# Patient Record
Sex: Female | Born: 1935 | Race: White | Hispanic: Yes | Marital: Married | Smoking: Never smoker
Health system: Southern US, Community
[De-identification: ages and names within clinical notes are randomized; demographics above are authoritative.]

## PROBLEM LIST (undated history)

## (undated) DIAGNOSIS — I10 Essential (primary) hypertension: Secondary | ICD-10-CM

## (undated) DIAGNOSIS — E785 Hyperlipidemia, unspecified: Secondary | ICD-10-CM

## (undated) DIAGNOSIS — R42 Dizziness and giddiness: Secondary | ICD-10-CM

## (undated) DIAGNOSIS — C50919 Malignant neoplasm of unspecified site of unspecified female breast: Secondary | ICD-10-CM

## (undated) DIAGNOSIS — I2510 Atherosclerotic heart disease of native coronary artery without angina pectoris: Secondary | ICD-10-CM

## (undated) DIAGNOSIS — K5790 Diverticulosis of intestine, part unspecified, without perforation or abscess without bleeding: Secondary | ICD-10-CM

## (undated) DIAGNOSIS — M329 Systemic lupus erythematosus, unspecified: Secondary | ICD-10-CM

## (undated) DIAGNOSIS — E039 Hypothyroidism, unspecified: Secondary | ICD-10-CM

## (undated) DIAGNOSIS — R55 Syncope and collapse: Secondary | ICD-10-CM

## (undated) DIAGNOSIS — IMO0002 Reserved for concepts with insufficient information to code with codable children: Secondary | ICD-10-CM

## (undated) HISTORY — DX: Malignant neoplasm of unspecified site of unspecified female breast: C50.919

## (undated) HISTORY — DX: Essential (primary) hypertension: I10

## (undated) HISTORY — DX: Dizziness and giddiness: R42

## (undated) HISTORY — DX: Hypothyroidism, unspecified: E03.9

## (undated) HISTORY — DX: Reserved for concepts with insufficient information to code with codable children: IMO0002

## (undated) HISTORY — DX: Hyperlipidemia, unspecified: E78.5

## (undated) HISTORY — PX: MASTECTOMY: SHX3

## (undated) HISTORY — DX: Syncope and collapse: R55

## (undated) HISTORY — PX: COLON RESECTION: SHX5231

## (undated) HISTORY — DX: Diverticulosis of intestine, part unspecified, without perforation or abscess without bleeding: K57.90

## (undated) HISTORY — PX: APPENDECTOMY: SHX54

## (undated) HISTORY — DX: Atherosclerotic heart disease of native coronary artery without angina pectoris: I25.10

## (undated) HISTORY — DX: Systemic lupus erythematosus, unspecified: M32.9

## (undated) HISTORY — PX: ABDOMINAL HYSTERECTOMY: SHX81

---

## 2006-03-12 ENCOUNTER — Encounter: Admission: RE | Admit: 2006-03-12 | Discharge: 2006-03-12 | Payer: Self-pay | Admitting: Family Medicine

## 2006-08-23 ENCOUNTER — Encounter: Admission: RE | Admit: 2006-08-23 | Discharge: 2006-08-23 | Payer: Self-pay | Admitting: Family Medicine

## 2006-08-26 ENCOUNTER — Encounter: Admission: RE | Admit: 2006-08-26 | Discharge: 2006-08-26 | Payer: Self-pay | Admitting: Family Medicine

## 2006-08-26 ENCOUNTER — Encounter (INDEPENDENT_AMBULATORY_CARE_PROVIDER_SITE_OTHER): Payer: Self-pay | Admitting: *Deleted

## 2006-09-05 ENCOUNTER — Encounter: Admission: RE | Admit: 2006-09-05 | Discharge: 2006-09-05 | Payer: Self-pay | Admitting: Family Medicine

## 2006-09-09 ENCOUNTER — Encounter (INDEPENDENT_AMBULATORY_CARE_PROVIDER_SITE_OTHER): Payer: Self-pay | Admitting: Specialist

## 2006-09-09 ENCOUNTER — Encounter: Admission: RE | Admit: 2006-09-09 | Discharge: 2006-09-09 | Payer: Self-pay | Admitting: Family Medicine

## 2006-09-20 ENCOUNTER — Ambulatory Visit (HOSPITAL_BASED_OUTPATIENT_CLINIC_OR_DEPARTMENT_OTHER): Admission: RE | Admit: 2006-09-20 | Discharge: 2006-09-20 | Payer: Self-pay | Admitting: General Surgery

## 2006-09-20 ENCOUNTER — Encounter (INDEPENDENT_AMBULATORY_CARE_PROVIDER_SITE_OTHER): Payer: Self-pay | Admitting: General Surgery

## 2006-09-20 ENCOUNTER — Encounter: Admission: RE | Admit: 2006-09-20 | Discharge: 2006-09-20 | Payer: Self-pay | Admitting: General Surgery

## 2006-09-20 ENCOUNTER — Encounter (INDEPENDENT_AMBULATORY_CARE_PROVIDER_SITE_OTHER): Payer: Self-pay | Admitting: *Deleted

## 2006-10-09 ENCOUNTER — Encounter (INDEPENDENT_AMBULATORY_CARE_PROVIDER_SITE_OTHER): Payer: Self-pay | Admitting: Specialist

## 2006-10-10 ENCOUNTER — Inpatient Hospital Stay (HOSPITAL_COMMUNITY): Admission: RE | Admit: 2006-10-10 | Discharge: 2006-10-11 | Payer: Self-pay | Admitting: General Surgery

## 2006-10-18 ENCOUNTER — Ambulatory Visit: Payer: Self-pay | Admitting: Oncology

## 2006-10-22 LAB — CBC WITH DIFFERENTIAL/PLATELET
Basophils Absolute: 0.1 10*3/uL (ref 0.0–0.1)
EOS%: 10.1 % — ABNORMAL HIGH (ref 0.0–7.0)
Eosinophils Absolute: 1.2 10*3/uL — ABNORMAL HIGH (ref 0.0–0.5)
HCT: 33.6 % — ABNORMAL LOW (ref 34.8–46.6)
HGB: 11.4 g/dL — ABNORMAL LOW (ref 11.6–15.9)
MCH: 30.4 pg (ref 26.0–34.0)
MCV: 89.5 fL (ref 81.0–101.0)
MONO%: 6.3 % (ref 0.0–13.0)
NEUT%: 60.7 % (ref 39.6–76.8)
lymph#: 2.5 10*3/uL (ref 0.9–3.3)

## 2006-10-22 LAB — COMPREHENSIVE METABOLIC PANEL
AST: 16 U/L (ref 0–37)
BUN: 22 mg/dL (ref 6–23)
Calcium: 9.1 mg/dL (ref 8.4–10.5)
Chloride: 103 mEq/L (ref 96–112)
Creatinine, Ser: 0.89 mg/dL (ref 0.40–1.20)
Glucose, Bld: 83 mg/dL (ref 70–99)

## 2006-10-22 LAB — LACTATE DEHYDROGENASE: LDH: 165 U/L (ref 94–250)

## 2006-11-27 ENCOUNTER — Ambulatory Visit: Payer: Self-pay | Admitting: Internal Medicine

## 2007-04-21 ENCOUNTER — Ambulatory Visit: Payer: Self-pay | Admitting: Gastroenterology

## 2007-06-02 ENCOUNTER — Encounter: Admission: RE | Admit: 2007-06-02 | Discharge: 2007-06-02 | Payer: Self-pay | Admitting: General Surgery

## 2008-03-02 ENCOUNTER — Encounter: Admission: RE | Admit: 2008-03-02 | Discharge: 2008-03-02 | Payer: Self-pay | Admitting: General Surgery

## 2008-03-24 ENCOUNTER — Encounter: Admission: RE | Admit: 2008-03-24 | Discharge: 2008-03-24 | Payer: Self-pay | Admitting: General Surgery

## 2008-05-11 ENCOUNTER — Inpatient Hospital Stay (HOSPITAL_COMMUNITY): Admission: EM | Admit: 2008-05-11 | Discharge: 2008-05-15 | Payer: Self-pay | Admitting: Emergency Medicine

## 2008-05-12 ENCOUNTER — Encounter: Payer: Self-pay | Admitting: Internal Medicine

## 2008-06-02 ENCOUNTER — Encounter: Payer: Self-pay | Admitting: Internal Medicine

## 2008-06-17 ENCOUNTER — Encounter (HOSPITAL_COMMUNITY): Admission: RE | Admit: 2008-06-17 | Discharge: 2008-09-15 | Payer: Self-pay | Admitting: Cardiology

## 2008-09-16 ENCOUNTER — Encounter (HOSPITAL_COMMUNITY): Admission: RE | Admit: 2008-09-16 | Discharge: 2008-12-09 | Payer: Self-pay | Admitting: Cardiology

## 2008-09-24 ENCOUNTER — Encounter: Payer: Self-pay | Admitting: Internal Medicine

## 2008-12-13 ENCOUNTER — Encounter: Admission: RE | Admit: 2008-12-13 | Discharge: 2008-12-30 | Payer: Self-pay | Admitting: Neurology

## 2009-01-05 ENCOUNTER — Encounter: Admission: RE | Admit: 2009-01-05 | Discharge: 2009-02-03 | Payer: Self-pay | Admitting: Neurology

## 2009-03-02 ENCOUNTER — Emergency Department (HOSPITAL_COMMUNITY): Admission: EM | Admit: 2009-03-02 | Discharge: 2009-03-02 | Payer: Self-pay | Admitting: Emergency Medicine

## 2009-12-01 ENCOUNTER — Encounter: Payer: Self-pay | Admitting: Internal Medicine

## 2010-03-20 ENCOUNTER — Encounter: Payer: Self-pay | Admitting: Internal Medicine

## 2010-03-20 ENCOUNTER — Telehealth (INDEPENDENT_AMBULATORY_CARE_PROVIDER_SITE_OTHER): Payer: Self-pay | Admitting: *Deleted

## 2010-03-22 ENCOUNTER — Encounter: Payer: Self-pay | Admitting: Internal Medicine

## 2010-03-22 ENCOUNTER — Encounter (INDEPENDENT_AMBULATORY_CARE_PROVIDER_SITE_OTHER): Payer: Self-pay | Admitting: *Deleted

## 2010-03-22 ENCOUNTER — Ambulatory Visit: Payer: Self-pay | Admitting: Internal Medicine

## 2010-03-22 DIAGNOSIS — E785 Hyperlipidemia, unspecified: Secondary | ICD-10-CM

## 2010-03-22 DIAGNOSIS — R0602 Shortness of breath: Secondary | ICD-10-CM | POA: Insufficient documentation

## 2010-03-22 DIAGNOSIS — R42 Dizziness and giddiness: Secondary | ICD-10-CM

## 2010-03-22 DIAGNOSIS — R079 Chest pain, unspecified: Secondary | ICD-10-CM | POA: Insufficient documentation

## 2010-03-22 DIAGNOSIS — I10 Essential (primary) hypertension: Secondary | ICD-10-CM | POA: Insufficient documentation

## 2010-03-22 DIAGNOSIS — I2510 Atherosclerotic heart disease of native coronary artery without angina pectoris: Secondary | ICD-10-CM | POA: Insufficient documentation

## 2010-03-22 DIAGNOSIS — R011 Cardiac murmur, unspecified: Secondary | ICD-10-CM

## 2010-03-22 LAB — CONVERTED CEMR LAB
BUN: 19 mg/dL (ref 6–23)
Basophils Absolute: 0.1 10*3/uL (ref 0.0–0.1)
Basophils Relative: 0.7 % (ref 0.0–3.0)
CO2: 31 meq/L (ref 19–32)
Calcium: 9.3 mg/dL (ref 8.4–10.5)
Chloride: 104 meq/L (ref 96–112)
Creatinine, Ser: 0.8 mg/dL (ref 0.4–1.2)
Eosinophils Absolute: 0.2 10*3/uL (ref 0.0–0.7)
Eosinophils Relative: 2.3 % (ref 0.0–5.0)
GFR calc non Af Amer: 73.54 mL/min (ref >60)
Glucose, Bld: 92 mg/dL (ref 70–99)
HCT: 34.4 % — ABNORMAL LOW (ref 36.0–46.0)
Hemoglobin: 11.4 g/dL — ABNORMAL LOW (ref 12.0–15.0)
INR: 1.2 — ABNORMAL HIGH (ref 0.8–1.0)
Lymphocytes Relative: 22.4 % (ref 12.0–46.0)
Lymphs Abs: 1.6 10*3/uL (ref 0.7–4.0)
MCHC: 33.2 g/dL (ref 30.0–36.0)
MCV: 90.4 fL (ref 78.0–100.0)
Monocytes Absolute: 0.5 10*3/uL (ref 0.1–1.0)
Monocytes Relative: 6.3 % (ref 3.0–12.0)
Neutro Abs: 4.8 10*3/uL (ref 1.4–7.7)
Neutrophils Relative %: 68.3 % (ref 43.0–77.0)
Platelets: 265 10*3/uL (ref 150.0–400.0)
Potassium: 3.7 meq/L (ref 3.5–5.1)
Prothrombin Time: 12.7 s — ABNORMAL HIGH (ref 9.1–11.7)
RBC: 3.8 M/uL — ABNORMAL LOW (ref 3.87–5.11)
RDW: 12 % (ref 11.5–14.6)
Sodium: 141 meq/L (ref 135–145)
WBC: 7.2 10*3/uL (ref 4.5–10.5)

## 2010-03-23 ENCOUNTER — Inpatient Hospital Stay (HOSPITAL_BASED_OUTPATIENT_CLINIC_OR_DEPARTMENT_OTHER): Admission: RE | Admit: 2010-03-23 | Discharge: 2010-03-23 | Payer: Self-pay | Admitting: Internal Medicine

## 2010-03-23 ENCOUNTER — Ambulatory Visit: Payer: Self-pay | Admitting: Internal Medicine

## 2010-03-24 ENCOUNTER — Ambulatory Visit (HOSPITAL_COMMUNITY): Admission: RE | Admit: 2010-03-24 | Discharge: 2010-03-24 | Payer: Self-pay | Admitting: Internal Medicine

## 2010-03-24 ENCOUNTER — Encounter: Payer: Self-pay | Admitting: Internal Medicine

## 2010-03-24 ENCOUNTER — Ambulatory Visit: Payer: Self-pay

## 2010-03-24 ENCOUNTER — Ambulatory Visit: Payer: Self-pay | Admitting: Cardiology

## 2010-03-27 ENCOUNTER — Telehealth (INDEPENDENT_AMBULATORY_CARE_PROVIDER_SITE_OTHER): Payer: Self-pay | Admitting: Radiology

## 2010-03-28 ENCOUNTER — Ambulatory Visit: Payer: Self-pay | Admitting: Cardiology

## 2010-03-28 ENCOUNTER — Ambulatory Visit: Payer: Self-pay

## 2010-03-28 ENCOUNTER — Encounter (HOSPITAL_COMMUNITY): Admission: RE | Admit: 2010-03-28 | Discharge: 2010-05-31 | Payer: Self-pay | Admitting: Internal Medicine

## 2010-03-30 ENCOUNTER — Telehealth: Payer: Self-pay | Admitting: Internal Medicine

## 2010-03-31 HISTORY — PX: CARDIAC CATHETERIZATION: SHX172

## 2010-04-03 ENCOUNTER — Telehealth (INDEPENDENT_AMBULATORY_CARE_PROVIDER_SITE_OTHER): Payer: Self-pay | Admitting: *Deleted

## 2010-04-05 ENCOUNTER — Encounter: Payer: Self-pay | Admitting: Internal Medicine

## 2010-04-19 ENCOUNTER — Ambulatory Visit: Payer: Self-pay | Admitting: Internal Medicine

## 2010-04-27 ENCOUNTER — Encounter (HOSPITAL_COMMUNITY): Admission: RE | Admit: 2010-04-27 | Discharge: 2010-07-26 | Payer: Self-pay | Admitting: Internal Medicine

## 2010-04-27 ENCOUNTER — Encounter: Payer: Self-pay | Admitting: Internal Medicine

## 2010-05-01 ENCOUNTER — Encounter: Payer: Self-pay | Admitting: Internal Medicine

## 2010-05-08 ENCOUNTER — Ambulatory Visit (HOSPITAL_COMMUNITY)
Admission: RE | Admit: 2010-05-08 | Discharge: 2010-05-08 | Payer: Self-pay | Source: Home / Self Care | Admitting: Internal Medicine

## 2010-05-08 ENCOUNTER — Ambulatory Visit: Payer: Self-pay | Admitting: Internal Medicine

## 2010-05-09 ENCOUNTER — Encounter: Payer: Self-pay | Admitting: Internal Medicine

## 2010-05-11 ENCOUNTER — Encounter: Payer: Self-pay | Admitting: Internal Medicine

## 2010-05-26 ENCOUNTER — Ambulatory Visit: Payer: Self-pay | Admitting: Psychology

## 2010-06-15 ENCOUNTER — Ambulatory Visit: Payer: Self-pay | Admitting: Internal Medicine

## 2010-06-16 ENCOUNTER — Ambulatory Visit: Payer: Self-pay | Admitting: Psychology

## 2010-07-05 ENCOUNTER — Telehealth: Payer: Self-pay | Admitting: Internal Medicine

## 2010-07-07 ENCOUNTER — Encounter: Payer: Self-pay | Admitting: Internal Medicine

## 2010-07-26 ENCOUNTER — Encounter: Payer: Self-pay | Admitting: Internal Medicine

## 2010-07-31 ENCOUNTER — Encounter: Payer: Self-pay | Admitting: Internal Medicine

## 2010-08-08 ENCOUNTER — Telehealth: Payer: Self-pay | Admitting: Internal Medicine

## 2010-08-09 ENCOUNTER — Encounter: Payer: Self-pay | Admitting: Internal Medicine

## 2010-09-22 ENCOUNTER — Encounter: Payer: Self-pay | Admitting: Internal Medicine

## 2010-10-26 ENCOUNTER — Encounter: Payer: Self-pay | Admitting: Internal Medicine

## 2010-10-26 ENCOUNTER — Telehealth: Payer: Self-pay | Admitting: Internal Medicine

## 2010-10-27 ENCOUNTER — Ambulatory Visit: Payer: Self-pay | Admitting: Internal Medicine

## 2010-10-27 ENCOUNTER — Encounter: Payer: Self-pay | Admitting: Cardiovascular Disease

## 2010-10-28 ENCOUNTER — Encounter: Payer: Self-pay | Admitting: Cardiovascular Disease

## 2010-11-01 ENCOUNTER — Telehealth (INDEPENDENT_AMBULATORY_CARE_PROVIDER_SITE_OTHER): Payer: Self-pay | Admitting: Radiology

## 2010-11-02 ENCOUNTER — Encounter: Payer: Self-pay | Admitting: *Deleted

## 2010-11-02 ENCOUNTER — Ambulatory Visit: Payer: Self-pay

## 2010-11-02 ENCOUNTER — Ambulatory Visit: Payer: Self-pay | Admitting: Cardiology

## 2010-11-02 ENCOUNTER — Encounter (HOSPITAL_COMMUNITY)
Admission: RE | Admit: 2010-11-02 | Discharge: 2010-12-30 | Payer: Self-pay | Source: Home / Self Care | Attending: Cardiovascular Disease | Admitting: Cardiovascular Disease

## 2010-11-07 ENCOUNTER — Telehealth (INDEPENDENT_AMBULATORY_CARE_PROVIDER_SITE_OTHER): Payer: Self-pay | Admitting: *Deleted

## 2010-11-08 ENCOUNTER — Telehealth (INDEPENDENT_AMBULATORY_CARE_PROVIDER_SITE_OTHER): Payer: Self-pay | Admitting: *Deleted

## 2010-11-09 ENCOUNTER — Encounter: Payer: Self-pay | Admitting: Internal Medicine

## 2010-11-14 ENCOUNTER — Ambulatory Visit: Payer: Self-pay | Admitting: Internal Medicine

## 2010-12-18 ENCOUNTER — Ambulatory Visit: Payer: Self-pay | Admitting: Internal Medicine

## 2011-01-10 ENCOUNTER — Ambulatory Visit
Admission: RE | Admit: 2011-01-10 | Discharge: 2011-01-10 | Payer: Self-pay | Source: Home / Self Care | Attending: Internal Medicine | Admitting: Internal Medicine

## 2011-01-10 ENCOUNTER — Other Ambulatory Visit: Payer: Self-pay | Admitting: Internal Medicine

## 2011-01-10 LAB — CBC WITH DIFFERENTIAL/PLATELET
Basophils Absolute: 0 10*3/uL (ref 0.0–0.1)
Basophils Relative: 0.4 % (ref 0.0–3.0)
Eosinophils Absolute: 0.1 10*3/uL (ref 0.0–0.7)
Eosinophils Relative: 1.7 % (ref 0.0–5.0)
HCT: 32.5 % — ABNORMAL LOW (ref 36.0–46.0)
Hemoglobin: 11.1 g/dL — ABNORMAL LOW (ref 12.0–15.0)
Lymphocytes Relative: 25.4 % (ref 12.0–46.0)
Lymphs Abs: 1.8 10*3/uL (ref 0.7–4.0)
MCHC: 34.1 g/dL (ref 30.0–36.0)
MCV: 88.8 fl (ref 78.0–100.0)
Monocytes Absolute: 0.6 10*3/uL (ref 0.1–1.0)
Monocytes Relative: 7.9 % (ref 3.0–12.0)
Neutro Abs: 4.5 10*3/uL (ref 1.4–7.7)
Neutrophils Relative %: 64.6 % (ref 43.0–77.0)
Platelets: 284 10*3/uL (ref 150.0–400.0)
RBC: 3.66 Mil/uL — ABNORMAL LOW (ref 3.87–5.11)
RDW: 13.6 % (ref 11.5–14.6)
WBC: 7 10*3/uL (ref 4.5–10.5)

## 2011-01-10 LAB — LIPID PANEL
Cholesterol: 157 mg/dL (ref 0–200)
HDL: 62.2 mg/dL (ref >39.00)
LDL Cholesterol: 86 mg/dL (ref 0–99)
Total CHOL/HDL Ratio: 3
Triglycerides: 42 mg/dL (ref 0.0–149.0)
VLDL: 8.4 mg/dL (ref 0.0–40.0)

## 2011-01-10 LAB — BASIC METABOLIC PANEL
BUN: 21 mg/dL (ref 6–23)
CO2: 28 mEq/L (ref 19–32)
Calcium: 9.4 mg/dL (ref 8.4–10.5)
Chloride: 104 mEq/L (ref 96–112)
Creatinine, Ser: 0.7 mg/dL (ref 0.4–1.2)
GFR: 85.62 mL/min (ref >60.00)
Glucose, Bld: 88 mg/dL (ref 70–99)
Potassium: 4.3 mEq/L (ref 3.5–5.1)
Sodium: 142 mEq/L (ref 135–145)

## 2011-01-10 LAB — HEPATIC FUNCTION PANEL
ALT: 13 U/L (ref 0–35)
AST: 21 U/L (ref 0–37)
Albumin: 4 g/dL (ref 3.5–5.2)
Alkaline Phosphatase: 52 U/L (ref 39–117)
Bilirubin, Direct: 0.2 mg/dL (ref 0.0–0.3)
Total Bilirubin: 0.6 mg/dL (ref 0.3–1.2)
Total Protein: 6.8 g/dL (ref 6.0–8.3)

## 2011-01-30 NOTE — Progress Notes (Signed)
Summary: stress echo pre-procedure  Phone Note Outgoing Call   Call placed by: Domenic Polite, CNMT,  November 01, 2010 3:46 PM Call placed to: Patient Reason for Call: Confirm/change Appt Summary of Call: Spoke with patient and confirmed instructions for stress echo. Initial call taken by: Domenic Polite, CNMT,  November 01, 2010 3:46 PM

## 2011-01-30 NOTE — Progress Notes (Signed)
Summary: paper need to be faxed  Phone Note Call from Patient Call back at Home Phone [phone]   Caller: Daughter- Darl Pikes [phone] Reason for Call: Talk to Nurse Details for Reason: need paper work faxed to [facility] [place] . faxed [phone].  Initial call taken by: Lorne Skeens,  July 05, 2010 9:47 AM  Follow-up for Phone Call        pt going to stay w/her daughter for a while in [place], [place] and needs to attend rehab there per Darl Pikes some info needs to be faxed, there phone # is [phone], will call and see what they need and send it over Meredith Staggers, RN  July 05, 2010 1:33 PM   have called and left mess at rehab for them to call back about switching pt Meredith Staggers, RN  July 07, 2010 11:54 AM   Additional Follow-up for Phone Call Additional follow up Details #1::        Fleet Contras calling back from Cardiac Rehab [phone], Migdalia Dk  July 07, 2010 4:44 PM   Left message for Fleet Contras to call back. Julieta Gutting, RN, BSN  July 07, 2010 4:51 PM 07/10/10--spoke to rachael at cardiac rehab [place] about pt starting c. rehab there--she will fax referall to their facility and we will fax her notes, D/C summary and all other pertenient info to get pt started there--nt Additional Follow-up by: Ledon Snare, RN,  July 10, 2010 10:23 AM

## 2011-01-30 NOTE — Miscellaneous (Signed)
Summary: Physician Response to Notification of Patient Event   Physician Response to Notification of Patient Event   Imported By: Roderic Ovens 08/07/2010 11:18:08  _____________________________________________________________________  External Attachment:    Type:   Image     Comment:   External Document

## 2011-01-30 NOTE — Letter (Signed)
Summary: [facility] Referral Form  [facility] Referral Form   Imported By: Marylou Mccoy 08/16/2010 08:57:35  _____________________________________________________________________  External Attachment:    Type:   Image     Comment:   External Document

## 2011-01-30 NOTE — Miscellaneous (Signed)
Summary: [facility]  [facility]   Imported By: Roderic Ovens 05/25/2010 13:39:18  _____________________________________________________________________  External Attachment:    Type:   Image     Comment:   External Document

## 2011-01-30 NOTE — Letter (Signed)
Summary: Dr Ollen Gross T[facility]'s Office Note  Dr Ollen Gross [facility]'s Office Note   Imported By: Roderic Ovens 04/26/2010 12:49:45  _____________________________________________________________________  External Attachment:    Type:   Image     Comment:   External Document

## 2011-01-30 NOTE — Letter (Signed)
Summary: [name]  [facility]   Imported By: Marylou Mccoy 08/22/2010 10:42:27  _____________________________________________________________________  External Attachment:    Type:   Image     Comment:   External Document

## 2011-01-30 NOTE — Letter (Signed)
Summary: Cardiac Catheterization  Cardiac Catheterization   Imported By: Marylou Mccoy 07/19/2010 18:44:57  _____________________________________________________________________  External Attachment:    Type:   Image     Comment:   External Document

## 2011-01-30 NOTE — Assessment & Plan Note (Signed)
Summary: per check out/sf    Visit Type:  Follow-up Primary Provider:  Yehuda Budd  CC:  No complains.  History of Present Illness: 77 y/o CAD, HTN, HL, chronic syncope/dizziness and diverticular disease s/p partial colon resection.  In May 2009 underwent heart cath by Dr. Elsie Lincoln for atypical CP and SOB. Non-obstructive disease in RCA and LCX. Had lesion in LAD underwent PCI but had dissection and needed four Promus stents to LAD. Diagonal was compromised. Had repeat heart catheterization art [facility] and everything looked fine. No real improvement in symptoms.   I saw her recently for ongonig CP and dyspnea. Eventual had repeat cath 4/11 which showed:  1. Coronary artery disease with patent left anterior descending stents     and diffuse disease and diagonal, which is small. 2. There is a borderline obtuse marginal lesion in the area of about     70%. 3. Right heart pressures are normal.  Had Myoview and echo which were normal so referred for cardiac rehab and cpx test. CPX test totally normal with pVO2 18.0 (106% predicted) RER 1.17 Slope 29 and normal spirometry.  Returns for f/u. Now feeling MUCH better with cardiac reha. No further CP or SOB. Exercise tolerance much improved. Likes the cameraderie. BP also doing well.    Current Medications (verified): 1)  Nitrostat 0.4 Mg Subl (Nitroglycerin) .Marland Kitchen.. 1 Tablet Under Tongue At Onset of Chest Pain; You May Repeat Every 5 Minutes For Up To 3 Doses. 2)  Plavix 75 Mg Tabs (Clopidogrel Bisulfate) .... Take One Tablet By Mouth Daily 3)  Aspirin 81 Mg Tbec (Aspirin) .... Take 2 Tablet By Mouth Daily 4)  Crestor 20 Mg Tabs (Rosuvastatin Calcium) .... Take 1/2 Tablet By Mouth Daily. 5)  Isosorbide Mononitrate Cr 30 Mg Xr24h-Tab (Isosorbide Mononitrate) .... Take 1/2 Tablet By Mouth Daily 6)  Amlodipine Besylate 5 Mg Tabs (Amlodipine Besylate) .... Take One Tablet By Mouth Daily 7)  Synthroid 100 Mcg Tabs (Levothyroxine Sodium) .... Once Daily 8)   Lisinopril-Hydrochlorothiazide 20-25 Mg Tabs (Lisinopril-Hydrochlorothiazide) .... Once Daily 9)  Pepcid 20 Mg Tabs (Famotidine) .... Two Times A Day 10)  Vesicare 5 Mg Tabs (Solifenacin Succinate) .... Once Daily  Allergies: 1)  ! Pcn 2)  ! Sulfa 3)  ! Codeine 4)  ! * Latex 5)  ! Valium  Past History:  Past Medical History: 1. CAD     --s/p PCI of LAD in May 2009 c/b dissection -> Promus to LAD x 4     --October 2009 ([place]) stents ok.    --cath 4/11: ptentt LAD stents, 70% OM lesion. normal EF and RH pressures    --f/u Myoview normal perfusion 2. HTN 3. HYPERLIPIDEMIA-MIXED  4. Chronic dizziness/syncope 5.  Breast CA (localized) s/p B mastectomy.     --No chemo/XRT 6. Hypothyroidism  7. Hx of Lupus 8. Diverticular disease s/p partial colon resection  Review of Systems       As per HPI and past medical history; otherwise all systems negative.   Vital Signs:  Patient profile:   77 year old female Height:      60 inches Weight:      139.50 pounds BMI:     27.34 Pulse rate:   72 / minute Pulse rhythm:   regular Resp:     18 per minute BP sitting:   128 / 70  (left arm) Cuff size:   large  Vitals Entered By: Vikki Ports (June 15, 2010 9:38 AM)  Physical Exam  General:  Gen: well appearing. no resp difficulty HEENT: normal Neck: supple. no JVD. Carotids 2+ bilat; no bruits. No lymphadenopathy or thryomegaly appreciated. Cor: s/p B mastectomy. PMI nondisplaced. Regular rate & rhythm. No rubs, gallops. soft systolic murmur. at RSB and apex Lungs: clear Abdomen: soft, nontender, nondistended. No hepatosplenomegaly. No bruits or masses. Good bowel sounds. Extremities: no cyanosis, clubbing, rash, edema Neuro: alert & orientedx3, cranial nerves grossly intact. moves all 4 extremities w/o difficulty. affect pleasant    Impression & Recommendations:  Problem # 1:  CAD, NATIVE VESSEL (ICD-414.01) Has responded very well to cardiac rehab. Continue current  regimen.  Problem # 2:  HYPERTENSION, UNSPECIFIED (ICD-401.9) Blood pressure well controlled. Continue current regimen.  Patient Instructions: 1)  Your physician recommends that you schedule a follow-up appointment in: 6 months

## 2011-01-30 NOTE — Miscellaneous (Signed)
Summary: [facility]   [facility]   Imported By: Roderic Ovens 09/28/2010 15:08:26  _____________________________________________________________________  External Attachment:    Type:   Image     Comment:   External Document

## 2011-01-30 NOTE — Progress Notes (Signed)
   Faxed Pt RO over through Fax.Marland KitchenMarland KitchenShe faxed back I faxed over to [facility] to fax [phone].Marland KitchenMarland KitchenPt would like to be seen here as a NP will wait for records [facility]  March 20, 2010 4:11 PM

## 2011-01-30 NOTE — Letter (Signed)
Summary: [facility]  [facility]   Imported By: Marylou Mccoy 07/19/2010 18:40:45  _____________________________________________________________________  External Attachment:    Type:   Image     Comment:   External Document

## 2011-01-30 NOTE — Letter (Signed)
Summary: Cardiac Catheterization Instructions- [facility]  [employer], Main Office  [address]   [place], [place] [address]   Phone: [phone]  Fax: [phone]     03/22/2010 MRN: [id number]  [name] [address] [place], [place]  [address]  Dear [name],   You are scheduled for a Cardiac Catheterization on 03/23/10 with Dr.Bensimhon  Please arrive to the 1st floor of the Heart and Vascular Center at [facility] at 11:00 am on the day of your procedure. Please do not arrive before 6:30 a.m. Call the Heart and Vascular Center at [phone] if you are unable to make your appointmnet. The Code to get into the parking garage under the building is 9000. Take the elevators to the 1st floor. You must have someone to drive you home. Someone must be with you for the first 24 hours after you arrive home. Please wear clothes that are easy to get on and off and wear slip-on shoes. Do not eat or drink after midnight except water with your medications that morning. Bring all your medications and current insurance cards with you.   X Make sure you take your aspirin and Plavix.  X You may take ALL of your medications with water that morning.  The usual length of stay after your procedure is 2 to 3 hours. This can vary.  If you have any questions, please call the office at the number listed above.   Hardin Negus, RMA

## 2011-01-30 NOTE — Progress Notes (Signed)
Summary: refill  Phone Note Refill Request Message from:  Patient on November 07, 2010 4:29 PM  Refills Requested: Medication #1:  LISINOPRIL-HYDROCHLOROTHIAZIDE 20-25 MG TABS once daily [facility] [phone] fax [phone]  Initial call taken by: Judie Grieve,  November 07, 2010 4:29 PM  Follow-up for Phone Call        Rx faxed to pharmacy. Vikki Ports  November 07, 2010 4:52 PM     Prescriptions: LISINOPRIL-HYDROCHLOROTHIAZIDE 20-25 MG TABS (LISINOPRIL-HYDROCHLOROTHIAZIDE) once daily  #30 x 6   Entered by:   Vikki Ports   Authorized by:   Norva Karvonen, MD   Signed by:   Vikki Ports on 11/07/2010   Method used:   Faxed to ...       [facility] [address] (retail)       [address]       [place], [place]  [address]       Ph: [phone]       Fax: [phone]   RxID:   [id number]

## 2011-01-30 NOTE — Letter (Signed)
Summary: [facility]  [facility]   Imported By: Marylou Mccoy 07/19/2010 18:45:57  _____________________________________________________________________  External Attachment:    Type:   Image     Comment:   External Document

## 2011-01-30 NOTE — Progress Notes (Signed)
Summary: chest pain  Phone Note From Other Clinic   Caller: Dr Herb Grays Summary of Call: she is concerned pt is having chest pressure and has also dev ankle edema in past few weeks, should like pt to be seen tom. appt sch w/Dr Excell Seltzer tom at 3pm Initial call taken by: Meredith Staggers, RN,  October 26, 2010 5:03 PM

## 2011-01-30 NOTE — Letter (Signed)
Summary: Dr. Kerri Perches Office - Flu Shot, Not Feeling Well  Dr. Kerri Perches Office - Flu Shot, Not Feeling Well   Imported By: Marylou Mccoy 10/27/2010 08:43:52  _____________________________________________________________________  External Attachment:    Type:   Image     Comment:   External Document

## 2011-01-30 NOTE — Progress Notes (Signed)
Summary: elevated BP/pt rtn call  Phone Note Outgoing Call   Call placed by: Meredith Staggers, RN,  August 08, 2010 4:46 PM Call placed to: Patient Summary of Call: received info from [facility] that pts BP has been elevated running 120s-180/60-80s, per Dr Gala Romney pt needs to increase Norvasc to 10mg  once daily, have called pt and left mess to call back   Follow-up for Phone Call        pt called and left a phone # [phone], called pt back and spoke w/her she states she has completed her cardiac rehab and wantst to cont w/next phase, rehab told her that we would have to call and ok that, will call them in AM [phone], disucssed w/pt about elevated BP and Dr Bensimhon's recommendation, she states she feels her BP is not usually that high and wants to check it at home for a while before she increases med, she will begin checking it dailyi and keep a record she will call me back on Mon or Tue w/update Meredith Staggers, RN  August 09, 2010 5:33 PM   Additional Follow-up for Phone Call Additional follow up Details #1::        pt rtn your call and you can reach her at [phone] Omer Jack  August 14, 2010 1:34 PM   spoke w/pt BP readings:            AM            LUNCH       PM 8/11 113/80 55, 136/88 68, 140/62 64 8/12 140/66 74, 103/60 87, 132/68 71 8/13 115/68 56, 106/65 84, 127/53 72 8/14 167/66 66, 125/62 81, 119/62 67 8/15 151/69 77, 129/72 73, 141/64 82 she will go ahead and increase Amlodipine to 10mg  daily and Dr Gala Romney recommended, she will cont. to monitor BP.  Have called rehab center and left mess to call back Meredith Staggers, RN  August 14, 2010 4:59 PM   Fleet Contras is returning your call [phone] [address]  August 17, 2010 12:25 PM    Additional Follow-up for Phone Call Additional follow up Details #2::    spoke w/Rachel, pt has completed phase II there is no program further than that, they offer pts a 30 day membership to a gym that has the same  equipment and BP machine if pts want to try that and she feels that physically pt is ready for that, advised that sounded great she will contact pt Meredith Staggers, RN  August 17, 2010 1:31 PM   New/Updated Medications: AMLODIPINE BESYLATE 10 MG TABS (AMLODIPINE BESYLATE) Take one tablet by mouth daily

## 2011-01-30 NOTE — Assessment & Plan Note (Signed)
Summary: np6/ unstable angina/ appt is 9:30 / gd  Medications Added CRESTOR 5 MG TABS (ROSUVASTATIN CALCIUM) Take one tablet by mouth daily. ISOSORBIDE MONONITRATE CR 30 MG XR24H-TAB (ISOSORBIDE MONONITRATE) Take one tablet by mouth daily ISOSORBIDE MONONITRATE CR 30 MG XR24H-TAB (ISOSORBIDE MONONITRATE) Take 1/2 tablet by mouth daily AMLODIPINE BESYLATE 5 MG TABS (AMLODIPINE BESYLATE) Take one tablet by mouth daily SYNTHROID 100 MCG TABS (LEVOTHYROXINE SODIUM) once daily LISINOPRIL 20 MG TABS (LISINOPRIL) Take one tablet by mouth daily PEPCID 20 MG TABS (FAMOTIDINE) two times a day VESICARE 5 MG TABS (SOLIFENACIN SUCCINATE) once daily      Allergies Added: ! PCN ! SULFA ! CODEINE ! * LATEX  Visit Type:  Initial Consult Primary Provider:  Yehuda Budd  CC:  chest pain & SOB.  History of Present Illness: 77 y/o CAD, HTN, HL, chronic syncope/dizziness and diverticular disease s/p partial colon resection.  In May 2009 underwent heart cath by Dr. Elsie Lincoln for atypical CP and SOB. Non-obstructive disease in RCA and LCX. Had lesion in LAD underwent PCI but had dissection and needed four Promus stents to LAD. Diagonal was compromised.  Went to CR.  Which was difficult. Despite CR cotninued to feel SOB and dizzy with occastional CP. In October 2009, went to visit her daughter in [place]. Was walking dog up the hill had severe CP and SOB. BP was very high. Had repeat heart catheterization art [facility] and everything looked fine.   Sincethat time has continued to feel bad. On bad days, has pressure in chest and sob can happen at rest or with exertion. Typically just lies down and goes away in a few minutes. Occasional mild ankle swelling. Several pillow orthopnea. No PND. Has problems sleeping. She has not been told that she snores.   Over past 2 months symptoms have gotten more frequent. Now gets CP and SOB almost every day. When walking the dog feels tired and SOB and dizzy but keeps going and then  on way back begins to develop CP. Feels presyncopal. Feels like heart is going fast. Has worn a monitor 2x in past without any significant abnormalities. No wheezing. Non-smoker.   Has history of infrequent syncope beginning at age 68. Which sounds like it may be orthostatic (versus vasovagal) in nature.   Has a lot of emotional stress. One daughter was murdered 4 years ago and another daughter is a single mom.     Preventive Screening-Counseling & Management  Alcohol-Tobacco     Smoking Status: never  Caffeine-Diet-Exercise     Does Patient Exercise: yes      Drug Use:  no.    Problems Prior to Update: 1)  Cad, Native Vessel  (ICD-414.01) 2)  Murmur  (ICD-785.2) 3)  Dizziness  (ICD-780.4) 4)  Hypertension, Unspecified  (ICD-401.9) 5)  Hyperlipidemia-mixed  (ICD-272.4) 6)  Chest Pain-unspecified  (ICD-786.50) 7)  Dyspnea  (ICD-786.05)  Medications Prior to Update: 1)  Nitrostat 0.4 Mg Subl (Nitroglycerin) .Marland Kitchen.. 1 Tablet Under Tongue At Onset of Chest Pain; You May Repeat Every 5 Minutes For Up To 3 Doses. 2)  Plavix 75 Mg Tabs (Clopidogrel Bisulfate) .... Take One Tablet By Mouth Daily 3)  Aspirin 81 Mg Tbec (Aspirin) .... Take 2 Tablet By Mouth Daily 4)  Crestor 20 Mg Tabs (Rosuvastatin Calcium) .... Take One Tablet By Mouth Daily. 5)  Metoprolol Succinate 50 Mg Xr24h-Tab (Metoprolol Succinate) .... Take One Tablet By Mouth Daily 6)  Amlodipine Besylate 10 Mg Tabs (Amlodipine Besylate) .... Take One Tablet By  Mouth Daily 7)  Synthroid 75 Mcg Tabs (Levothyroxine Sodium) .... Once Daily 8)  Singulair 10 Mg Tabs (Montelukast Sodium) .... As Needed  Current Medications (verified): 1)  Nitrostat 0.4 Mg Subl (Nitroglycerin) .Marland Kitchen.. 1 Tablet Under Tongue At Onset of Chest Pain; You May Repeat Every 5 Minutes For Up To 3 Doses. 2)  Plavix 75 Mg Tabs (Clopidogrel Bisulfate) .... Take One Tablet By Mouth Daily 3)  Aspirin 81 Mg Tbec (Aspirin) .... Take 2 Tablet By Mouth Daily 4)  Crestor  5 Mg Tabs (Rosuvastatin Calcium) .... Take One Tablet By Mouth Daily. 5)  Isosorbide Mononitrate Cr 30 Mg Xr24h-Tab (Isosorbide Mononitrate) .... Take 1/2 Tablet By Mouth Daily 6)  Amlodipine Besylate 5 Mg Tabs (Amlodipine Besylate) .... Take One Tablet By Mouth Daily 7)  Synthroid 100 Mcg Tabs (Levothyroxine Sodium) .... Once Daily 8)  Lisinopril 20 Mg Tabs (Lisinopril) .... Take One Tablet By Mouth Daily 9)  Pepcid 20 Mg Tabs (Famotidine) .... Two Times A Day 10)  Vesicare 5 Mg Tabs (Solifenacin Succinate) .... Once Daily  Allergies (verified): 1)  ! Pcn 2)  ! Sulfa 3)  ! Codeine 4)  ! * Latex  Past History:  Family History: Last updated: 03/22/2010 Family History of Cancer:   Social History: Last updated: 03/22/2010 Retired  Married  Tobacco Use - No.  Alcohol Use - yes -- rarely Regular Exercise - yes -- walking dog Drug Use - no  Risk Factors: Exercise: yes (03/22/2010)  Risk Factors: Smoking Status: never (03/22/2010)  Past Medical History: 1. CAD     --s/p PCI of LAD in May 2009 c/b dissection -> Promus to LAD x 4     --October 2009 ([facility]) stents ok. 2. HTN 3. HYPERLIPIDEMIA-MIXED  4. Chronic dizziness/syncope 5.  Breast CA (localized) s/p B mastectomy.     --No chemo/XRT 6. Hypothyroidism  7. Hx of Lupus 8. Diverticular disease s/p partial colon resection  Past Surgical History: Mastectomy - bilateral colon resection Abdominal Hysterectomy-Total Appendectomy Cardiac Catheterization & stent  Family History: Reviewed history and no changes required. Family History of Cancer:   Social History: Reviewed history and no changes required. Retired  Married  Tobacco Use - No.  Alcohol Use - yes -- rarely Regular Exercise - yes -- walking dog Drug Use - no Smoking Status:  never Does Patient Exercise:  yes Drug Use:  no  Review of Systems       As per HPI and past medical history; otherwise all systems negative.   Vital Signs:  Patient  profile:   77 year old female Height:      60 inches Weight:      140 pounds BMI:     27.44 Pulse rate:   71 / minute Pulse (ortho):   77 / minute BP sitting:   132 / 74  (left arm) BP standing:   118 / 58 Cuff size:   regular  Vitals Entered By: Hardin Negus, RMA (March 22, 2010 9:48 AM)  Serial Vital Signs/Assessments:  Time      Position  BP       Pulse  Resp  Temp     By           Lying RA  132/60   64                    Hardin Negus, RMA           Sitting   120/66   67  Hardin Negus, RMA           Standing  118/58   77                    Hardin Negus, [place]  Comments: 2 mins 136/64   P 64   5 mins 126/58   P 72 slightly dizzy when she stood up, got a little better as she stood By: Hardin Negus, RMA  11:15 Pt walked in office about 2 mins. O2 sats stayed between 97 - 99%. Pulse from 77 went up to 104. As pulse went up dizziness got worse and CP started at 100 bpm. By: Hardin Negus, RMA    Physical Exam  General:  Gen: well appearing. no resp difficulty HEENT: normal Neck: supple. no JVD. Carotids 2+ bilat; no bruits. No lymphadenopathy or thryomegaly appreciated. Cor: s/p B mastectomy. PMI nondisplaced. Regular rate & rhythm. No rubs, gallops. soft systolic murmur. at RSB and apex Lungs: clear Abdomen: soft, nontender, nondistended. No hepatosplenomegaly. No bruits or masses. Good bowel sounds. Extremities: no cyanosis, clubbing, rash, edema Neuro: alert & orientedx3, cranial nerves grossly intact. moves all 4 extremities w/o difficulty. affect pleasant    Problems:  Medical Problems Added: 1)  Dx of Cad, Native Vessel  (ICD-414.01) 2)  Dx of Murmur  (ICD-785.2) 3)  Dx of Dizziness  (ICD-780.4) 4)  Dx of Hypertension, Unspecified  (ICD-401.9) 5)  Dx of Hyperlipidemia-mixed  (ICD-272.4) 6)  Dx of Chest Pain-unspecified  (ICD-786.50) 7)  Dx of Dyspnea  (ICD-786.05)  Impression & Recommendations:  Problem # 1:  CHEST  PAIN-UNSPECIFIED (ICD-786.50) Difficult situation. As symptoms getting worse in the face of known CAD I think she warrants a re-look R and L heart cath to rule out instent restenosis and evaluate hemodynamics. That said, I also think tthere may be a significant component of depression and grief which is also playing a part in her symptoms. We had a long discussion about this. The paln will be to strat with cath and echo. If normal may consider CPX testing to more clearly evlauate her response to exercise and rule out component of pulmonary disease. I also think she will warrnt a visit with Dr. Dellia Cloud to discuss her probable depression.   Orders: EKG w/ Interpretation (93000) Echocardiogram (Echo) TLB-BMP (Basic Metabolic Panel-BMET) (80048-METABOL) TLB-CBC Platelet - w/Differential (85025-CBCD) TLB-PT (Protime) (85610-PTP) T-2 View CXR (71020TC) Cardiac Catheterization (Cardiac Cath)  Problem # 2:  DIZZINESS (ICD-780.4) I suspect she has vaso-vagal syncope with possible component of orthostastis. Will check orthostatics.   Problem # 3:  MURMUR (ICD-785.2) Check echo.   Problem # 4:  CAD, NATIVE VESSEL (ICD-414.01)  As above.Continue asa/plavix.  Orders: EKG w/ Interpretation (93000) Echocardiogram (Echo) TLB-BMP (Basic Metabolic Panel-BMET) (80048-METABOL) TLB-CBC Platelet - w/Differential (85025-CBCD) TLB-PT (Protime) (85610-PTP) T-2 View CXR (71020TC) Cardiac Catheterization (Cardiac Cath)  CHF Assessment/Plan:      The patient's current weight is 140 pounds.     Patient Instructions: 1)  Your physician recommends that you have lab work today BMP, CBC, PT/INR 2)  Chest Xray obtained in the office today 3)  Your physician recommends that you continue on your current medications as directed. Please refer to the Current Medication list given to you today. 4)  Your physician has requested that you have an echocardiogram.  Echocardiography is a painless test that uses sound  waves to create images of your heart. It provides your doctor with information about the size and shape of your  heart and how well your heart's chambers and valves are working.  This procedure takes approximately one hour. There are no restrictions for this procedure. 5)  Your physician has requested that you have a cardiac catheterization.  Cardiac catheterization is used to diagnose and/or treat various heart conditions. Doctors may recommend this procedure for a number of different reasons. The most common reason is to evaluate chest pain. Chest pain can be a symptom of coronary artery disease (CAD), and cardiac catheterization can show whether plaque is narrowing or blocking your heart's arteries. This procedure is also used to evaluate the valves, as well as measure the blood flow and oxygen levels in different parts of your heart.  For further information please visit [email or web address]  Please follow instruction sheet, as given.

## 2011-01-30 NOTE — Assessment & Plan Note (Signed)
Summary: 9:00  rov  Medications Added CRESTOR 20 MG TABS (ROSUVASTATIN CALCIUM) Take 1/2 tablet by mouth daily. LISINOPRIL-HYDROCHLOROTHIAZIDE 20-25 MG TABS (LISINOPRIL-HYDROCHLOROTHIAZIDE) once daily      Allergies Added: ! VALIUM  Visit Type:  Follow-up Primary Provider:  Yehuda Budd  CC:  some pain and feels like lungs are full.  History of Present Illness: 77 y/o CAD, HTN, HL, chronic syncope/dizziness and diverticular disease s/p partial colon resection.  In May 2009 underwent heart cath by Dr. Elsie Lincoln for atypical CP and SOB. Non-obstructive disease in RCA and LCX. Had lesion in LAD underwent PCI but had dissection and needed four Promus stents to LAD. Diagonal was compromised. Had repeat heart catheterization art [facility] and everything looked fine. No real improvement in symptoms.   Sincethat time has continued to feel bad. On bad days, has pressure in chest and sob can happen at rest or with exertion. Typically just lies down and goes away in a few minutes. Occasional mild ankle swelling. Several pillow orthopnea. No PND. Has problems sleeping. She has not been told that she snores.   Over past 2 months symptoms have gotten more frequent. Now gets CP and SOB almost every day. When walking the dog feels tired and SOB and dizzy but keeps going and then on way back begins to develop CP. Feels presyncopal. Feels like heart is going fast. Has worn a monitor 2x in past without any significant abnormalities. No wheezing. Non-smoker.   Has history of infrequent syncope beginning at age 76. Which sounds like it may be orthostatic (versus vasovagal) in nature.   Has a lot of emotional stress. One daughter was murdered 4 years ago and another daughter is a single mom.     Current Medications (verified): 1)  Nitrostat 0.4 Mg Subl (Nitroglycerin) .Marland Kitchen.. 1 Tablet Under Tongue At Onset of Chest Pain; You May Repeat Every 5 Minutes For Up To 3 Doses. 2)  Plavix 75 Mg Tabs (Clopidogrel Bisulfate) ....  Take One Tablet By Mouth Daily 3)  Aspirin 81 Mg Tbec (Aspirin) .... Take 2 Tablet By Mouth Daily 4)  Crestor 20 Mg Tabs (Rosuvastatin Calcium) .... Take 1/2 Tablet By Mouth Daily. 5)  Isosorbide Mononitrate Cr 30 Mg Xr24h-Tab (Isosorbide Mononitrate) .... Take 1/2 Tablet By Mouth Daily 6)  Amlodipine Besylate 5 Mg Tabs (Amlodipine Besylate) .... Take One Tablet By Mouth Daily 7)  Synthroid 100 Mcg Tabs (Levothyroxine Sodium) .... Once Daily 8)  Lisinopril-Hydrochlorothiazide 20-25 Mg Tabs (Lisinopril-Hydrochlorothiazide) .... Once Daily 9)  Pepcid 20 Mg Tabs (Famotidine) .... Two Times A Day 10)  Vesicare 5 Mg Tabs (Solifenacin Succinate) .... Once Daily  Allergies (verified): 1)  ! Pcn 2)  ! Sulfa 3)  ! Codeine 4)  ! * Latex 5)  ! Valium  Vital Signs:  Patient profile:   77 year old female Height:      60 inches Weight:      141 pounds BMI:     27.64 Pulse rate:   70 / minute BP sitting:   124 / 66  (right arm) Cuff size:   regular  Vitals Entered By: Hardin Negus, RMA (April 19, 2010 9:16 AM)   Impression & Recommendations:  Problem # 1:  CHEST PAIN-UNSPECIFIED (ICD-786.50) We had a long discussion about what to do here. She continues to have CP and exertional dyspnea but this dod not improve with previous stenting. She currently has a borderline lesion in her LCX but Myoview does not show any ischemia. Thus I am unusre  if this lesion is the cause of her symptoms. We discussed the options and at this point we have decided to pursue CPX testing and cardiac rehab to see if that makes a difference. We will also refer her to Dr. Dellia Cloud to deal with her emotional stress. If symtpoms get worse or CPX testing shows a signifcant cardiac limitation we can consider PCI of the OM lesion.   Other Orders: CPX Test at [facility] (CPX Test)  Patient Instructions: 1)  Your physician has recommended that you have a cardiopulmonary stress test (CPX).  CPX testing is a  non-invasive measurement of heart and lung function. It replaces a traditional treadmill stress test. This type of test provides a tremendous amount of information that relates not only to your present condition but also for future outcomes.  This test combines measurements of your ventilation, respiratory gas exchange in the lungs, electrocardiogram (EKG), blood pressure and physical response before, during, and following an exercise protocol. 2)  Follow up in 2 months

## 2011-01-30 NOTE — Letter (Signed)
Summary: [facility]  [facility]   Imported By: Marylou Mccoy 07/19/2010 18:47:00  _____________________________________________________________________  External Attachment:    Type:   Image     Comment:   External Document

## 2011-01-30 NOTE — Progress Notes (Signed)
   Recieved records from [facility] forwarded to [facility]. [facility]  April 03, 2010 8:36 AM

## 2011-01-30 NOTE — Progress Notes (Signed)
Summary: pt needs refill  Phone Note Refill Request Message from:  Pharmacy on [address]  Refills Requested: Medication #1:  LISINOPRIL-HYDROCHLOROTHIAZIDE 20-25 MG TABS once daily Initial call taken by: Omer Jack,  November 08, 2010 9:52 AM  Follow-up for Phone Call        Rx called to pharmacy . Vikki Ports  November 08, 2010 11:38 AM

## 2011-01-30 NOTE — Cardiovascular Report (Signed)
Summary: Pre Cath Orders   Pre Cath Orders   Imported By: Roderic Ovens 04/06/2010 14:46:51  _____________________________________________________________________  External Attachment:    Type:   Image     Comment:   External Document

## 2011-01-30 NOTE — Assessment & Plan Note (Signed)
Summary: Cardiology Nuclear Testing  Nuclear Med Background Indications for Stress Test: Evaluation for Ischemia, Stent Patency  Indications Comments: .  History: Echo, Heart Catheterization, Myocardial Perfusion Study, Stents  History Comments: 3/11 [id number]  Symptoms: Chest Tightness, Chest Tightness with Exertion, Dizziness, DOE, Fatigue, Near Syncope, Palpitations, Rapid HR, SOB  Symptoms Comments: CP>back.   Last episode of CP:Now, 7/10.   Nuclear Pre-Procedure Cardiac Risk Factors: Hypertension, Lipids Caffeine/Decaff Intake: None NPO After: 6:00 PM Lungs: Clear IV 0.9% NS with Angio Cath: 22g     IV Site: L Antecubital IV Started by: Bonnita Levan, RN Height (in): 60 Weight (lb): 132 BMI: 25.87  Nuclear Med Study 1 or 2 day study:  1 day     Stress Test Type:  Stress Reading MD:  Cassell Clement, MD     Referring MD:  Tonny Bollman, MD Resting Radionuclide:  Technetium 84m Tetrofosmin     Resting Radionuclide Dose:  11.0 mCi  Stress Radionuclide:  Technetium 58m Tetrofosmin     Stress Radionuclide Dose:  33.0 mCi   Stress Protocol Exercise Time (min):  5:00 min     Max HR:  142 bpm     Predicted Max HR:  141 bpm  Max Systolic BP: 150 mm Hg     Percent Max HR:  100.71 %     METS: 7.0 Rate Pressure Product:  30865    Stress Test Technologist:  Rea [employer], CMA-N     Nuclear Technologist:  Domenic Polite, CNMT  Rest Procedure  Myocardial perfusion imaging was performed at rest 45 minutes following the intravenous administration of Technetium 59m Tetrofosmin.  Stress Procedure  The patient was initially scheduled for a stress echo today, but due to poor image quality her study was changed to a myoview per Dr. Excell Seltzer.  The patient exercised for five minutes on the Bruce protocol.  The patient stopped due to fatigue.  She did c/o chest tightness, 10/10, at peak exercise.  There were no diagnostic ST-T wave changes, only occasional PAC's and PVC's.  Technetium 81m  Tetrofosmin was injected at peak exercise and myocardial perfusion imaging was performed after a brief delay.  QPS Raw Data Images:  Normal; no motion artifact; normal heart/lung ratio. Stress Images:  Normal homogeneous uptake in all areas of the myocardium. Rest Images:  Normal homogeneous uptake in all areas of the myocardium. Subtraction (SDS):  No evidence of ischemia. Transient Ischemic Dilatation:  1.13  (Normal <1.22)  Lung/Heart Ratio:  .35  (Normal <0.45)  Quantitative Gated Spect Images QGS EDV:  52 ml QGS ESV:  15 ml QGS EF:  71 % QGS cine images:  Normal LV systolic function.  No wall motion abnormalities.  Findings Normal nuclear study Clinically Abnormal (chest pain, ST abnormality, hypotension)      Overall Impression  Exercise Capacity: Fair exercise capacity. BP Response: Normal blood pressure response. Clinical Symptoms: Mild chest pain/dyspnea. ECG Impression: No significant ST segment change suggestive of ischemia. Overall Impression: Normal stress nuclear study.

## 2011-01-30 NOTE — Assessment & Plan Note (Signed)
Summary: ROV   Visit Type:  Follow-up Primary Provider:  Yehuda Budd  CC:  chest pain, sob, and dizziness.  History of Present Illness: 77 y/o CAD, HTN, HL, chronic syncope/dizziness and diverticular disease s/p partial colon resection.  In May 2009 underwent heart cath by Dr. Elsie Lincoln for atypical CP and SOB. Non-obstructive disease in RCA and LCX. Had lesion in LAD underwent PCI but had dissection and needed four Promus stents to LAD. Diagonal was compromised. Had repeat heart catheterization in March 2011 demonstrating patent LAD stents with mild restenosis and moderate nonobstructive disease in the LCx.  The patient complains of chest tightness on an episodic basis, but sometimes brought on by walking or household chores. Also complains of lightheadedness and shortness of breath. She complains palpitations and heart racing that occurs with emotional stress or worry.     Current Medications (verified): 1)  Nitrostat 0.4 Mg Subl (Nitroglycerin) .Marland Kitchen.. 1 Tablet Under Tongue At Onset of Chest Pain; You May Repeat Every 5 Minutes For Up To 3 Doses. 2)  Plavix 75 Mg Tabs (Clopidogrel Bisulfate) .... Take One Tablet By Mouth Daily 3)  Aspirin 81 Mg Tbec (Aspirin) .... Take 2 Tablet By Mouth Daily 4)  Crestor 20 Mg Tabs (Rosuvastatin Calcium) .... Take 1/2 Tablet By Mouth Daily. 5)  Isosorbide Mononitrate Cr 30 Mg Xr24h-Tab (Isosorbide Mononitrate) .... Take 1/2 Tablet By Mouth Daily 6)  Amlodipine Besylate 10 Mg Tabs (Amlodipine Besylate) .... Take One Tablet By Mouth Daily 7)  Synthroid 100 Mcg Tabs (Levothyroxine Sodium) .... Once Daily 8)  Lisinopril-Hydrochlorothiazide 20-25 Mg Tabs (Lisinopril-Hydrochlorothiazide) .... Once Daily 9)  Pepcid 20 Mg Tabs (Famotidine) .... Two Times A Day 10)  Vesicare 5 Mg Tabs (Solifenacin Succinate) .... Once Daily  Allergies: 1)  ! Pcn 2)  ! Sulfa 3)  ! Codeine 4)  ! * Latex 5)  ! Valium  Past History:  Past medical history reviewed for relevance to  current acute and chronic problems.  Past Medical History: Reviewed history from 06/15/2010 and no changes required. 1. CAD     --s/p PCI of LAD in May 2009 c/b dissection -> Promus to LAD x 4     --October 2009 ([place]) stents ok.    --cath 4/11: ptentt LAD stents, 70% OM lesion. normal EF and RH pressures    --f/u Myoview normal perfusion 2. HTN 3. HYPERLIPIDEMIA-MIXED  4. Chronic dizziness/syncope 5.  Breast CA (localized) s/p B mastectomy.     --No chemo/XRT 6. Hypothyroidism  7. Hx of Lupus 8. Diverticular disease s/p partial colon resection  Review of Systems       Negative except as per HPI   Vital Signs:  Patient profile:   77 year old female Height:      60 inches Weight:      132.75 pounds Pulse rate:   95 / minute Pulse (ortho):   88 / minute Pulse rhythm:   regular Resp:     18 per minute BP standing:   120 / 49  Vitals Entered By: Vikki Ports (October 27, 2010 3:14 PM)  Serial Vital Signs/Assessments:  Time      Position  BP       Pulse  Resp  Temp     By 3:21 PM   Lying LA  127/61   79                    Luz Jaramillo 3:23 PM   Sitting   109/47   82  Vikki Ports 3:23 PM   Standing  120/49   88                    Luz Jaramillo 3:26 PM   Standing  120/62   88                    Luz Jaramillo 3:30 PM   Standing  105/57   83                    Vikki Ports  Comments: 3:30 PM Lying- No symptoms Sitting- Little dizzy Standing- Dizzy Standing 2 - 3 minutes- dizzy By: Vikki Ports    Physical Exam  General:  Pt is alert and oriented, anxious woman in no acute distress. HEENT: normal Neck: normal carotid upstrokes with bilateral soft bruits, JVP normal Lungs: CTA CV: RRR with 2/6 systolic murmur at the LSB Abd: soft, NT, positive BS, no bruit, no organomegaly Ext: no clubbing, cyanosis, or edema. peripheral pulses 2+ and equal Skin: warm and dry without rash    EKG  Procedure date:  10/27/2010  Findings:      NSR  95 bpm, septal MI age-indeterminate, T wave abnormality consider inferior ischemia.  Impression & Recommendations:  Problem # 1:  CAD, NATIVE VESSEL (ICD-414.01) The patient has chest pain with both typical and atypical features. Her symptoms are concerning but difficult to sort out as she has a chronic chest pain syndrome. Her last catheterizatoin was about 6 months ago and demonstrated patency of the LAD stents. Recommend an exercise echo and plan invasive eval if the study is high-risk. Otherwise continue current medical program.  Her updated medication list for this problem includes:    Nitrostat 0.4 Mg Subl (Nitroglycerin) .Marland Kitchen... 1 tablet under tongue at onset of chest pain; you may repeat every 5 minutes for up to 3 doses.    Plavix 75 Mg Tabs (Clopidogrel bisulfate) .Marland Kitchen... Take one tablet by mouth daily    Aspirin 81 Mg Tbec (Aspirin) .Marland Kitchen... Take 2 tablet by mouth daily    Isosorbide Mononitrate Cr 30 Mg Xr24h-tab (Isosorbide mononitrate) .Marland Kitchen... Take 1/2 tablet by mouth daily    Amlodipine Besylate 10 Mg Tabs (Amlodipine besylate) .Marland Kitchen... Take one tablet by mouth daily    Lisinopril-hydrochlorothiazide 20-25 Mg Tabs (Lisinopril-hydrochlorothiazide) ..... Once daily  Orders: EKG w/ Interpretation (93000) Stress Echo (Stress Echo)  Problem # 2:  HYPERTENSION, UNSPECIFIED (ICD-401.9) Well-controlled.  Her updated medication list for this problem includes:    Aspirin 81 Mg Tbec (Aspirin) .Marland Kitchen... Take 2 tablet by mouth daily    Amlodipine Besylate 10 Mg Tabs (Amlodipine besylate) .Marland Kitchen... Take one tablet by mouth daily    Lisinopril-hydrochlorothiazide 20-25 Mg Tabs (Lisinopril-hydrochlorothiazide) ..... Once daily  Orders: EKG w/ Interpretation (93000) Stress Echo (Stress Echo)  BP today: 109/47 Prior BP: 128/70 (06/15/2010)  Labs Reviewed: K+: 3.7 (03/22/2010) Creat: : 0.8 (03/22/2010)     Problem # 3:  HYPERLIPIDEMIA-MIXED (ICD-272.4)  Her updated medication list for this problem  includes:    Crestor 20 Mg Tabs (Rosuvastatin calcium) .Marland Kitchen... Take 1/2 tablet by mouth daily.  Orders: EKG w/ Interpretation (93000) Stress Echo (Stress Echo)  Patient Instructions: 1)  Your physician recommends that you schedule a follow-up appointment with Dr Gala Romney in 2 WEEKS. 2)  Your physician has requested that you have a stress echocardiogram. For further information please visit [email or web address]  Please follow instruction sheet as given. 3)  Your physician recommends that you continue  on your current medications as directed. Please refer to the Current Medication list given to you today.

## 2011-01-30 NOTE — Assessment & Plan Note (Signed)
Summary: Cardiology Nuclear Study  Nuclear Med Background Indications for Stress Test: Evaluation for Ischemia, Stent Patency  Indications Comments: 03/23/10- Cath- Assess for possible lateral wall  ischemia.  History: Abnormal EKG, Echo, Heart Catheterization, Stents  History Comments: 2009- Strent x 4-LAD; 3/11 Echo:EF=60%; 03/23/10 Cath: Patent Stents, diffuse disease Diag2 (95%) and CFX(70%)  Symptoms: Chest Pain, Chest Pressure, Chest Pressure with Exertion, Diaphoresis, Dizziness, DOE, Near Syncope, Rapid HR, SOB, Syncope  Symptoms Comments: CP>back.  Last episode of CP:2 days ago.   Nuclear Pre-Procedure Cardiac Risk Factors: Hypertension, Lipids Caffeine/Decaff Intake: none NPO After: 6:00 AM Lungs: Clear.  O2 Sat 99% on RA. IV 0.9% NS with Angio Cath: 22g     IV Site: (R) wrist IV Started by: Stanton Kidney EMT-P Chest Size (in) 36     Cup Size B     Height (in): 60 Weight (lb): 141 BMI: 27.64  Nuclear Med Study 1 or 2 day study:  1 day     Stress Test Type:  Eugenie Birks Reading MD:  Marca Ancona, MD     Referring MD:  [name], MD Resting Radionuclide:  Technetium 5m Tetrofosmin     Resting Radionuclide Dose:  10.5 mCi  Stress Radionuclide:  Technetium 36m Tetrofosmin     Stress Radionuclide Dose:  33 mCi   Stress Protocol   Lexiscan: 0.4 mg   Stress Test Technologist:  Rea [employer] CMA-N     Nuclear Technologist:  Domenic Polite CNMT  Rest Procedure  Myocardial perfusion imaging was performed at rest 45 minutes following the intravenous administration of Myoview Technetium 78m Tetrofosmin.  Stress Procedure  The patient received IV Lexiscan 0.4 mg over 15-seconds.  Myoview injected at 30-seconds.  There were no significant changes with lexiscan.  She did c/o chest pressure with lexiscan.  Quantitative spect images were obtained after a 45 minute delay.  QPS Raw Data Images:  Normal; no motion artifact; normal heart/lung ratio. Stress Images:  NI: Uniform  and normal uptake of tracer in all myocardial segments. Rest Images:  Normal homogeneous uptake in all areas of the myocardium. Subtraction (SDS):  There is no evidence of scar or ischemia. Transient Ischemic Dilatation:  1.06  (Normal <1.22)  Lung/Heart Ratio:  .32  (Normal <0.45)  Quantitative Gated Spect Images QGS EDV:  55 ml QGS ESV:  10 ml QGS EF:  81 % QGS cine images:  Normal wall motion.    Overall Impression  Exercise Capacity: Lexiscan study.  BP Response: Normal blood pressure response. Clinical Symptoms: Shortness of breath, chest pressure.  ECG Impression: No significant ST segment change suggestive of ischemia. Overall Impression: Normal stress nuclear study.  Appended Document: Cardiology Nuclear Study no ischemia in LCX territory. let her know. will need appt within 2-3 weeks to discuss plan. thanks  Appended Document: Cardiology Nuclear Study pt aware, appt sch for 4/20 at 9am

## 2011-01-30 NOTE — Miscellaneous (Signed)
Summary: [facility]  [facility]   Imported By: Roderic Ovens 05/15/2010 14:31:50  _____________________________________________________________________  External Attachment:    Type:   Image     Comment:   External Document

## 2011-01-30 NOTE — Miscellaneous (Signed)
Summary: Outpatient Coinsurance Notice  Outpatient Coinsurance Notice   Imported By: Marylou Mccoy 03/29/2010 16:04:37  _____________________________________________________________________  External Attachment:    Type:   Image     Comment:   External Document

## 2011-01-30 NOTE — Miscellaneous (Signed)
Summary: [facility] Order/Treatment Plan  [facility] Order/Treatment Plan   Imported By: Roderic Ovens 04/26/2010 14:47:34  _____________________________________________________________________  External Attachment:    Type:   Image     Comment:   External Document

## 2011-01-30 NOTE — Progress Notes (Signed)
Summary: Nuc pre-Procedure  Phone Note Outgoing Call Call back at [facility] Phone [phone]   Call placed by: Rea [employer], CMA Call placed to: Patient Reason for Call: Confirm/change Appt Summary of Call: Left message with information on Myoview Information Sheet (see scanned document for details).      Nuclear Med Background Indications for Stress Test: Evaluation for Ischemia  Indications Comments: 03/23/10- Cath- Assess for possible lateral wall  ischemia.  History: Abnormal EKG, Echo, Heart Catheterization, Stents  History Comments: 2009- Strents-LAD(X4) 3/11- Echo- Ef= 60% 03/23/10- Cath- Patent Stents- LAD. Diffuse disease Diag2(95%) and CFX(70%)  Symptoms: Chest Pain, Chest Pressure, Chest Pressure with Exertion, Dizziness, DOE, Near Syncope, Rapid HR, Syncope    Nuclear Pre-Procedure Cardiac Risk Factors: Hypertension, Lipids Height (in): 60

## 2011-01-30 NOTE — Miscellaneous (Signed)
Summary: [facility] Physician Order/Treatment Plan   [facility] Physician Order/Treatment Plan   Imported By: Roderic Ovens 12/07/2010 10:22:03  _____________________________________________________________________  External Attachment:    Type:   Image     Comment:   External Document

## 2011-01-30 NOTE — Progress Notes (Signed)
Summary: test results   Phone Note Outgoing Call   Call placed by: Meredith Staggers, RN,  March 30, 2010 6:01 PM Call placed to: Patient Summary of Call: called pt w/echo and myoview results and to sch an appt w/Dr Dessire Grimes for f/u, no answer x10rings   Follow-up for Phone Call        pt given results, f/u appt sch for 4/20 at [facility], RN  April 03, 2010 4:16 PM

## 2011-01-30 NOTE — Letter (Signed)
[facility]  [facility]   Imported By: Marylou Mccoy 06/06/2010 11:46:38  _____________________________________________________________________  External Attachment:    Type:   Image     Comment:   External Document

## 2011-01-30 NOTE — Assessment & Plan Note (Signed)
Summary: 12:00/per check out/sf   Visit Type:  Follow-up Primary Provider:  Yehuda Budd  CC:  shortness of breath -- tight on occasion -- still unsteady.  History of Present Illness: 77 y/o CAD, HTN, HL, chronic syncope/dizziness and diverticular disease s/p partial colon resection.  In May 2009 underwent heart cath by Dr. Elsie Lincoln for atypical CP and SOB. Non-obstructive disease in RCA and LCX. Had lesion in LAD underwent PCI but had dissection and needed four Promus stents to LAD. Diagonal was compromised. Had repeat heart catheterization art [place] and everything looked fine. No real improvement in symptoms.   Repeat cath for recurrent CP 4/11:  1. Coronary artery disease with patent LAD stents with diffuse disease and diagonal, which is small. 2. There is a borderline OM lesion in the area of about 70%. 3. Right heart pressures normal.  Had Myoview and echo which were normal so referred for cardiac rehab and cpx test. CPX test totally normal with pVO2 18.0 (106% predicted) RER 1.17 Slope 29 and normal spirometry.  Saw Dr. Excell Seltzer recently for episode of CP and dyspnea. Had f/u Myoview 2 weeks ago (11/01/10). Walked 5 mins on [place]. EF normal. No ECG changes. Normal perfusion. Returns for f/u.   Feeling better but still with episodes of CP if she walks too fast. However, episodes can also happen at rest. [place] Spaniel she was in the [facility] earlier this year and noted she was more dyspneic. No HF. Compliant with meds. Multiple complaints about pain in legs, fatigue and arthritis.  Current Medications (verified): 1)  Nitrostat 0.4 Mg Subl (Nitroglycerin) .Marland Kitchen.. 1 Tablet Under Tongue At Onset of Chest Pain; You May Repeat Every 5 Minutes For Up To 3 Doses. 2)  Plavix 75 Mg Tabs (Clopidogrel Bisulfate) .... Take One Tablet By Mouth Daily 3)  Aspirin 81 Mg Tbec (Aspirin) .... Take 2 Tablet By Mouth Daily 4)  Crestor 20 Mg Tabs (Rosuvastatin Calcium) .... Take 1/2 Tablet By Mouth Daily. 5)   Isosorbide Mononitrate Cr 30 Mg Xr24h-Tab (Isosorbide Mononitrate) .... Take 1/2 Tablet By Mouth Daily 6)  Amlodipine Besylate 10 Mg Tabs (Amlodipine Besylate) .... Take One Tablet By Mouth Daily 7)  Synthroid 100 Mcg Tabs (Levothyroxine Sodium) .... Once Daily 8)  Lisinopril-Hydrochlorothiazide 20-25 Mg Tabs (Lisinopril-Hydrochlorothiazide) .... Once Daily 9)  Pepcid 20 Mg Tabs (Famotidine) .... Two Times A Day 10)  Vesicare 5 Mg Tabs (Solifenacin Succinate) .... Once Daily 11)  Bystolic 5 Mg Tabs (Nebivolol Hcl) .... When Bp Is Up  Allergies (verified): 1)  ! Pcn 2)  ! Sulfa 3)  ! Codeine 4)  ! * Latex 5)  ! Valium  Past History:  Past Medical History: Last updated: 06/15/2010 1. CAD     --s/p PCI of LAD in May 2009 c/b dissection -> Promus to LAD x 4     --October 2009 ([place]) stents ok.    --cath 4/11: ptentt LAD stents, 70% OM lesion. normal EF and RH pressures    --f/u Myoview normal perfusion 2. HTN 3. HYPERLIPIDEMIA-MIXED  4. Chronic dizziness/syncope 5.  Breast CA (localized) s/p B mastectomy.     --No chemo/XRT 6. Hypothyroidism  7. Hx of Lupus 8. Diverticular disease s/p partial colon resection  Review of Systems       As per HPI and past medical history; otherwise all systems negative.   Vital Signs:  Patient profile:   77 year old female Height:      60 inches Weight:      132  pounds BMI:     25.87 Pulse rate:   75 / minute BP sitting:   124 / 60  (right arm) Cuff size:   regular  Vitals Entered By: Hardin Negus, RMA (November 14, 2010 12:11 PM)  Physical Exam  General:  Pt is alert and oriented, anxious woman in no acute distress. HEENT: normal Neck: normal carotid upstrokes with bilateral soft bruits, JVP normal Lungs: CTA CV: s/p left mastectomy RRR with 2/6 systolic murmur at the LSB Abd: soft, NT, positive BS, no bruit, no organomegaly Ext: no clubbing, cyanosis, or edema. peripheral pulses 2+ and equal Skin: warm and dry without  rash     Impression & Recommendations:  Problem # 1:  CAD, NATIVE VESSEL (ICD-414.01) I have reviewed her cath and stress tests and I do not think that her symptoms are related primarily to myocardial ischemia. I feel some of her concerns may be related to higher than normal expectations for a person her age. Will continue with medical therapy for now. Refer back to cardiac rehab maintenance program for compenent of chronic stable angina.   Problem # 2:  HYPERTENSION, UNSPECIFIED (ICD-401.9) Blood pressure well controlled. Continue current regimen.  Problem # 3:  HYPERLIPIDEMIA-MIXED (ICD-272.4) Goal LDL < 70. Continue crestor. Recheck lipids and liver panel.   Patient Instructions: 1)  Your physician recommends that you return for a FASTING lipid, liver, bmet, and cbc (414.01, 272.0) 2)  Follow up in 3 months.

## 2011-01-30 NOTE — Miscellaneous (Signed)
Summary: [facility]  [facility]   Imported By: Roderic Ovens 05/10/2010 15:56:13  _____________________________________________________________________  External Attachment:    Type:   Image     Comment:   External Document

## 2011-01-30 NOTE — Miscellaneous (Signed)
Summary: Cardiac Exercise Report   Cardiac Exercise Report   Imported By: Roderic Ovens 12/08/2010 14:56:03  _____________________________________________________________________  External Attachment:    Type:   Image     Comment:   External Document

## 2011-02-21 ENCOUNTER — Ambulatory Visit (INDEPENDENT_AMBULATORY_CARE_PROVIDER_SITE_OTHER): Payer: Medicare Other | Admitting: Internal Medicine

## 2011-02-21 ENCOUNTER — Encounter: Payer: Self-pay | Admitting: Internal Medicine

## 2011-02-21 DIAGNOSIS — I2510 Atherosclerotic heart disease of native coronary artery without angina pectoris: Secondary | ICD-10-CM

## 2011-02-21 DIAGNOSIS — E785 Hyperlipidemia, unspecified: Secondary | ICD-10-CM

## 2011-02-21 DIAGNOSIS — I10 Essential (primary) hypertension: Secondary | ICD-10-CM

## 2011-02-22 ENCOUNTER — Ambulatory Visit: Payer: Self-pay | Admitting: Internal Medicine

## 2011-02-27 NOTE — Assessment & Plan Note (Signed)
Summary: f67m  per sue-mj   Visit Type:  Follow-up Primary Provider:  Yehuda Budd  CC:  sob, dizziness from meds?, and swelling occ.Marland Kitchen  History of Present Illness: [name] is a 77 y/o CAD, HTN, HL, chronic dyspnea//dizziness and diverticular disease s/p partial colon resection. Returns for routine f/u.   In May 2009 underwent heart cath by Dr. Elsie Lincoln for atypical CP and SOB. Non-obstructive disease in RCA and LCX. Had lesion in LAD underwent PCI but had dissection and needed four Promus stents to LAD. Diagonal was compromised. Had repeat heart catheterization art [place] and everything looked fine. No real improvement in symptoms.   Repeat cath for recurrent CP 4/11:  1. Coronary artery disease with patent LAD stents with diffuse disease and diagonal, which is small. 2. There is a borderline OM lesion in the area of about 70%. 3. Right heart pressures normal.  Had Myoview and echo which were normal so referred for cardiac rehab and cpx test. CPX test 5/11 totally normal with pVO2 18.0 (106% predicted) RER 1.17 Slope 29 and normal spirometry. Had f/u Myoview 11/01/10 for recurrent CP. Walked 5 mins on [place]. EF normal. No ECG changes. Normal perfusion. Returns for f/u.  Feeling much better. Walks every day.  Occasional CP and dyspnea but much improved. Spent fall in [facility] and noticed dyspnea more. Occasional mild ankle swelling. C/o sciatic pain in left leg. BP well controlled.  Recent lipids TC 157 TG 42 HDL 62 LDL 86  (Crestor subsequently increased from 10 once daily to 20 once daily.)   Current Medications (verified): 1)  Nitrostat 0.4 Mg Subl (Nitroglycerin) .Marland Kitchen.. 1 Tablet Under Tongue At Onset of Chest Pain; You May Repeat Every 5 Minutes For Up To 3 Doses. 2)  Plavix 75 Mg Tabs (Clopidogrel Bisulfate) .... Take One Tablet By Mouth Daily 3)  Aspirin 81 Mg Tbec (Aspirin) .... Take 2 Tablet By Mouth Daily 4)  Crestor 20 Mg Tabs (Rosuvastatin Calcium) .... Take 1 Tablet By Mouth  Daily. 5)  Amlodipine Besylate 10 Mg Tabs (Amlodipine Besylate) .... Take One Tablet By Mouth Daily 6)  Synthroid 100 Mcg Tabs (Levothyroxine Sodium) .... Once Daily 7)  Lisinopril-Hydrochlorothiazide 20-25 Mg Tabs (Lisinopril-Hydrochlorothiazide) .... Once Daily 8)  Pepcid 20 Mg Tabs (Famotidine) .... Two Times A Day 9)  Vesicare 5 Mg Tabs (Solifenacin Succinate) .... Once Daily 10)  Bystolic 5 Mg Tabs (Nebivolol Hcl) .... When Bp Is Up  Allergies (verified): 1)  ! Pcn 2)  ! Sulfa 3)  ! Codeine 4)  ! * Latex 5)  ! Valium  Past History:  Past Medical History: Last updated: 06/15/2010 1. CAD     --s/p PCI of LAD in May 2009 c/b dissection -> Promus to LAD x 4     --October 2009 ([place]) stents ok.    --cath 4/11: ptentt LAD stents, 70% OM lesion. normal EF and RH pressures    --f/u Myoview normal perfusion 2. HTN 3. HYPERLIPIDEMIA-MIXED  4. Chronic dizziness/syncope 5.  Breast CA (localized) s/p B mastectomy.     --No chemo/XRT 6. Hypothyroidism  7. Hx of Lupus 8. Diverticular disease s/p partial colon resection  Review of Systems       As per HPI and past medical history; otherwise all systems negative.   Vital Signs:  Patient profile:   77 year old female Height:      60 inches Weight:      134 pounds Pulse rate:   68 / minute Pulse rhythm:  regular BP sitting:   128 / 60  (right arm)  Vitals Entered By: Jacquelin Hawking, CMA (February 21, 2011 11:44 AM)  Physical Exam  General:  Well appearing. no acute distress  HEENT: normal Neck: normal carotid upstrokes with bilateral soft bruits, JVP normal Lungs: CTA CV: s/p left mastectomy RRR with 2/6 systolic murmur at the LSB Abd: soft, NT, positive BS, no bruit, no organomegaly Ext: no clubbing, cyanosis, or edema. peripheral pulses 2+ and equal Skin: warm and dry without rash    Impression & Recommendations:  Problem # 1:  CAD, NATIVE VESSEL (ICD-414.01) Stable. No evidence of ischemia. Continue current  regimen.  Problem # 2:  HYPERTENSION, UNSPECIFIED (ICD-401.9) Blood pressure well controlled. Continue current regimen.  Problem # 3:  HYPERLIPIDEMIA-MIXED (ICD-272.4) Lipids near goal. Crestor recently increased. F/u lipids and liver panel in 2 months.   Other Orders: EKG w/ Interpretation (93000)  Patient Instructions: 1)  Your physician recommends that you return for a FASTING lipid and liver profile: in 2 months (414.01, 272.1) 2)  Your physician wants you to follow-up in:  6 months.  You will receive a reminder letter in the mail two months in advance. If you don't receive a letter, please call our office to schedule the follow-up appointment.

## 2011-03-25 LAB — POCT I-STAT 3, VENOUS BLOOD GAS (G3P V)
Bicarbonate: 28.5 mEq/L — ABNORMAL HIGH (ref 20.0–24.0)
TCO2: 30 mmol/L (ref 0–100)
pCO2, Ven: 48.6 mmHg (ref 45.0–50.0)
pH, Ven: 7.377 — ABNORMAL HIGH (ref 7.250–7.300)
pO2, Ven: 35 mmHg (ref 30.0–45.0)

## 2011-03-25 LAB — POCT I-STAT 3, ART BLOOD GAS (G3+)
pCO2 arterial: 43.7 mmHg (ref 35.0–45.0)
pH, Arterial: 7.391 (ref 7.350–7.400)

## 2011-04-11 ENCOUNTER — Emergency Department (HOSPITAL_COMMUNITY): Payer: Medicare Other

## 2011-04-11 ENCOUNTER — Inpatient Hospital Stay (HOSPITAL_COMMUNITY)
Admission: EM | Admit: 2011-04-11 | Discharge: 2011-04-12 | DRG: 287 | Disposition: A | Discharge status: Home / Self Care | Payer: Medicare Other | Attending: Cardiovascular Disease | Admitting: Cardiovascular Disease

## 2011-04-11 DIAGNOSIS — IMO0002 Reserved for concepts with insufficient information to code with codable children: Secondary | ICD-10-CM | POA: Diagnosis present

## 2011-04-11 DIAGNOSIS — R079 Chest pain, unspecified: Secondary | ICD-10-CM

## 2011-04-11 DIAGNOSIS — D649 Anemia, unspecified: Secondary | ICD-10-CM | POA: Diagnosis present

## 2011-04-11 DIAGNOSIS — E785 Hyperlipidemia, unspecified: Secondary | ICD-10-CM | POA: Diagnosis present

## 2011-04-11 DIAGNOSIS — Z7902 Long term (current) use of antithrombotics/antiplatelets: Secondary | ICD-10-CM

## 2011-04-11 DIAGNOSIS — R072 Precordial pain: Principal | ICD-10-CM | POA: Diagnosis present

## 2011-04-11 DIAGNOSIS — Y998 Other external cause status: Secondary | ICD-10-CM

## 2011-04-11 DIAGNOSIS — Z7982 Long term (current) use of aspirin: Secondary | ICD-10-CM

## 2011-04-11 DIAGNOSIS — I10 Essential (primary) hypertension: Secondary | ICD-10-CM | POA: Diagnosis present

## 2011-04-11 DIAGNOSIS — I2510 Atherosclerotic heart disease of native coronary artery without angina pectoris: Secondary | ICD-10-CM

## 2011-04-11 DIAGNOSIS — T7020XA Unspecified effects of high altitude, initial encounter: Secondary | ICD-10-CM | POA: Diagnosis present

## 2011-04-11 DIAGNOSIS — M329 Systemic lupus erythematosus, unspecified: Secondary | ICD-10-CM | POA: Diagnosis present

## 2011-04-11 DIAGNOSIS — Z853 Personal history of malignant neoplasm of breast: Secondary | ICD-10-CM

## 2011-04-11 DIAGNOSIS — E039 Hypothyroidism, unspecified: Secondary | ICD-10-CM | POA: Diagnosis present

## 2011-04-11 LAB — CBC
Hemoglobin: 10.6 g/dL — ABNORMAL LOW (ref 12.0–15.0)
MCH: 29.7 pg (ref 26.0–34.0)
MCHC: 34.5 g/dL (ref 30.0–36.0)

## 2011-04-11 LAB — BASIC METABOLIC PANEL
CO2: 30 mEq/L (ref 19–32)
Chloride: 101 mEq/L (ref 96–112)
Creatinine, Ser: 0.8 mg/dL (ref 0.4–1.2)
GFR calc Af Amer: >60 mL/min (ref >60)
Potassium: 3.6 mEq/L (ref 3.5–5.1)
Sodium: 141 mEq/L (ref 135–145)

## 2011-04-11 LAB — APTT: aPTT: 24 seconds (ref 24–37)

## 2011-04-11 LAB — POCT CARDIAC MARKERS

## 2011-04-11 LAB — DIFFERENTIAL
Basophils Relative: 1 % (ref 0–1)
Eosinophils Absolute: 0.5 10*3/uL (ref 0.0–0.7)
Monocytes Absolute: 0.7 10*3/uL (ref 0.1–1.0)
Monocytes Relative: 10 % (ref 3–12)
Neutro Abs: 4.4 10*3/uL (ref 1.7–7.7)

## 2011-04-11 LAB — PROTIME-INR: INR: 1.06 (ref 0.00–1.49)

## 2011-04-12 LAB — BASIC METABOLIC PANEL
CO2: 30 mEq/L (ref 19–32)
Chloride: 103 mEq/L (ref 96–112)
GFR calc Af Amer: >60 mL/min (ref >60)
Potassium: 3.5 mEq/L (ref 3.5–5.1)
Sodium: 139 mEq/L (ref 135–145)

## 2011-04-12 LAB — CBC
Hemoglobin: 9.8 g/dL — ABNORMAL LOW (ref 12.0–15.0)
RBC: 3.39 MIL/uL — ABNORMAL LOW (ref 3.87–5.11)
WBC: 6.4 10*3/uL (ref 4.0–10.5)

## 2011-04-13 NOTE — Procedures (Signed)
  NAMEMYSTIC, LABO NO.:  [id number]  MEDICAL RECORD NO.:  [id number]           PATIENT TYPE:  I  LOCATION:  2011                         FACILITY:  [facility]  PHYSICIAN:  Noralyn Pick. Eden Emms, MD, FACCDATE OF BIRTH:  04/21/1929  DATE OF PROCEDURE:  04/11/2011 DATE OF DISCHARGE:                           CARDIAC CATHETERIZATION   An 77 year old patient of Dr. Gala Romney, with history of multiple stents to the LAD, admitted to the ER with recurrent chest pain, relieved with nitroglycerin.  Cine catheterization was done with a 5-French catheters from right femoral artery.  A JL-3.5 catheter was used to engage the left main. Left main coronary artery had 20% discrete stenosis.  Left anterior descending artery had widely patent stent to the mid and distal LAD.  First diagonal branch had 20% ostial lesion.  Second diagonal branch was a medium-sized vessel with 90% long diffuse disease compared to her previous films from 2011.  The disease has progressed slightly, however, she had significant disease then.  The distal LAD was normal.  Circumflex coronary artery was normal in the proximal and AV groove branch.  There was a 40% proximal lesion in the large first obtuse marginal branch.  Right coronary artery was dominant and normal.  RAO ventriculography.  RAO ventriculography was normal with EF of 65%. There is no gradient across the aortic valve and no MR.  LV pressure is 151/3.  Aortic pressure was 150/67.  IMPRESSION:  The patient's stents are widely patent at the left anterior descending coronary artery.  She has a diffusely diseased, somewhat small diagonal branch.  The disease was present on her previous cath in 2011.  The ostium of the second diagonal branch emanates from the stents and the vessel appears to be less than 2 mm.  I will review the films with Dr. Kirke Corin but suspect continued medical therapy is warranted.     Noralyn Pick. Eden Emms, MD,  [facility]     PCN/MEDQ  D:  04/11/2011  T:  04/12/2011  Job:  [id number]  Electronically Signed by Charlton Haws MD [facility] on 04/13/2011 03:36:36 PM

## 2011-04-19 NOTE — H&P (Signed)
NAMEGYPSY, KELLOGG NO.:  [id number]  MEDICAL RECORD NO.:  [id number]           PATIENT TYPE:  I  LOCATION:  2011                         FACILITY:  [facility]  PHYSICIAN:  Noralyn Pick. Eden Emms, MD, FACCDATE OF BIRTH:  Oct 15, 1929  DATE OF ADMISSION:  04/11/2011 DATE OF DISCHARGE:                             HISTORY & PHYSICAL   PRIMARY CARE PHYSICIAN:  [name], MD  PRIMARY CARDIOLOGIST:  Bevelyn Buckles. Bensimhon, MD  CHIEF COMPLAINT:  Chest pain.  HISTORY OF PRESENT ILLNESS:  [name] is an 77 year old female with a history of coronary artery disease.  She has a long history of exertional dyspnea that is associated with chest pain, diaphoresis, and nausea.  Dr. Gala Romney has followed her for this and treated her medically, he last saw her on February 21, 2011.  At that time, her symptoms had not progressed.  At 4 a.m. today, she was wakened by substernal chest pain that was all the way up her mid chest.  It radiated through to her back on the left side and into the left side of her jaw.  She describes it as a choking and squeezing and states that it was initially 10/10.  It did not change with deep inspiration or position.  It was associated with shortness of breath and diaphoresis and nausea, but no vomiting.  She stated it felt just like the symptoms that she had during her percutaneous intervention in 2009.  She took a sublingual nitroglycerin and her chest pain decreased to 5/10.  A second sublingual nitroglycerin helped as well but the pain returned.  EMS was called and they gave her 2 sublingual nitroglycerin which helped the pain.  IV nitroglycerin in the emergency room has almost relieved the pain.  She only describes a 1-2/10 pain now.  Along with her previous exertional symptoms, there has been dizziness and at her last office visit, her orthostatics were mildly positive but at this time, she is not having any dizziness.  PAST MEDICAL  HISTORY: 1. History of acute coronary syndrome in May 2009, with an LAD lesion     treated with stenting, dissection, required additional stenting up     to 57 mm of overlapping Palmaz stent. 2. Status post cardiac catheterization, right and left on March 24, 2010, showing LAD 40% in-stent restenosis, D2 95% (small vessel),     OM-1 70%, RCA 20%, EF 75%, PAS 28/2, with a mean of 14. 3. Hypertension. 4. Hyperlipidemia. 5. Lupus. 6. Hypothyroidism. 7. History of diverticulitis. 8. History of systolic ejection murmur with an echocardiogram in March     2011, showing no critical valvular abnormalities. 9. History of dyspnea on exertion, status post CPX in 2011, which     showed a pVO2 of 18 which is 106% of predicted with RAR 1.17, 29,     normal spirometry. 10.History of diverticulitis. 11.History of breast cancer.  SURGICAL HISTORY:  She is status post cardiac catheterizations as well as colon resection, bilateral mastectomy, appendectomy, and hysterectomy.  ALLERGIES:  She is allergic or intolerant to PENICILLIN, CODEINE, SULFA, and now MACRODANTIN.  CURRENT  MEDICATIONS: 1. Tylenol Extra Strength 500 mg 1-2 tabs q.6 hours p.r.n. 2. Lisinopril/HCTZ 20/25 daily. 3. Acetic acid/hydrocortisone ear drops daily. 4. Clobetasol cream daily. 5. Norvasc 10 mg a day. 6. Artificial tears q.i.d. 7. VESIcare 5 mg a day. 8. Crestor 20 mg a day. 9. Famotidine 20 mg b.i.d. 10.Aspirin 81 mg a day. 11.Plavix 75 mg a day. 12.Synthroid 100 mcg daily.  SOCIAL HISTORY:  She lives in [place] with family nearby.  She is retired.  She denies any history of alcohol, tobacco, or drug abuse.  FAMILY HISTORY:  She has relatives that died of breast cancer but no premature coronary artery disease in either of her parents who are both deceased or any siblings.  REVIEW OF SYSTEMS:  She has headaches.  She has chronic arthralgias and joint pains.  She feels anxious at times.  She has a history of  stress incontinence.  She has occasional reflux symptoms but denies melena. She has not had any recent illnesses, fevers, or chills.  Full 14-point review of systems is otherwise negative except as stated in the HPI.  PHYSICAL EXAM:  VITAL SIGNS:  Temperature is 98.1, blood pressure 117/98, heart rate 74, respiratory rate 19, O2 saturation 100% on 2 liters. GENERAL:  She is a well-developed elderly white female who appears slightly anxious but otherwise not in severe distress. HEENT:  Normal. NECK:  There is no lymphadenopathy, thyromegaly, or JVD noted.  Her murmur radiates to her right carotid. CARDIOVASCULAR:  Her heart is regular in rate and rhythm with an S1 and S2 and a soft systolic murmur is noted at the left upper sternal border. Distal pulses are intact in all 4 extremities. LUNGS:  Essentially clear to auscultation bilaterally. SKIN:  No rashes or lesions are noted. ABDOMEN:  Soft and nontender with active bowel sounds. EXTREMITIES:  There is no cyanosis, clubbing, or edema noted. MUSCULOSKELETAL:  There is no joint deformity or effusions and no spine or CVA tenderness. NEUROLOGIC:  She is alert and oriented.  Cranial nerves II-XII grossly intact.  Chest x-ray, no acute disease.  EKG is sinus rhythm, rate 71 with a left anterior fascicular block and Q- waves in V1 and V2.  No old is currently available for comparison.  LABORATORY VALUES:  Hemoglobin 10.6, hematocrit 30.7, WBC 7.1, platelets 243.  INR 1.06.  Sodium 141, potassium 3.6, chloride 101, CO2 30, BUN 19, creatinine 0.8, glucose 117.  Initial point-of-care markers are negative.  IMPRESSION:  [name] was seen today by Dr. Eden Emms, the patient evaluated and the data reviewed.  She has had recurrent exertional substernal chest pain but now has rest angina.  She has a history of an left anterior descending coronary artery stent.  She was started on nitrofurantoin for a prevention of frequent urinary tract  infections but has a lower extremity rash and this is discontinued.  Macrodantin should be added to her allergies.  The situation was discussed with the patient and her daughter.  The risks and benefits of cardiac catheterization were reviewed and cardiac catheterization was recommended to further define her anatomy.  The patient indicates understanding and agrees to proceed.  She has a murmur consistently and this will be followed as well.  She is anemic with a hemoglobin of 10.6 and previous hemoglobin was 11.1 in January 2012.  MCV is within normal limits but we will check an iron profile.     Theodore Demark, PA-C   ______________________________ Noralyn Pick. Eden Emms, MD, [facility]  RB/MEDQ  D:  04/11/2011  T:  04/12/2011  Job:  [id number]  Electronically Signed by Theodore Demark PA-C on 04/14/2011 04:55:09 PM Electronically Signed by Charlton Haws MD [facility] on 04/19/2011 11:31:05 AM

## 2011-04-24 ENCOUNTER — Other Ambulatory Visit (INDEPENDENT_AMBULATORY_CARE_PROVIDER_SITE_OTHER): Payer: Medicare Other | Admitting: *Deleted

## 2011-04-24 DIAGNOSIS — E781 Pure hyperglyceridemia: Secondary | ICD-10-CM

## 2011-04-24 DIAGNOSIS — I359 Nonrheumatic aortic valve disorder, unspecified: Secondary | ICD-10-CM

## 2011-04-24 LAB — LIPID PANEL
Cholesterol: 131 mg/dL (ref 0–200)
HDL: 56.8 mg/dL (ref >39.00)
LDL Cholesterol: 65 mg/dL (ref 0–99)
Total CHOL/HDL Ratio: 2
Triglycerides: 48 mg/dL (ref 0.0–149.0)

## 2011-04-24 LAB — HEPATIC FUNCTION PANEL
Albumin: 3.8 g/dL (ref 3.5–5.2)
Total Protein: 6.4 g/dL (ref 6.0–8.3)

## 2011-04-25 ENCOUNTER — Other Ambulatory Visit: Payer: Self-pay | Admitting: Emergency Medicine

## 2011-04-27 ENCOUNTER — Encounter: Payer: Self-pay | Admitting: Internal Medicine

## 2011-04-30 ENCOUNTER — Encounter: Payer: Self-pay | Admitting: *Deleted

## 2011-04-30 ENCOUNTER — Encounter: Payer: Self-pay | Admitting: Internal Medicine

## 2011-04-30 ENCOUNTER — Ambulatory Visit (INDEPENDENT_AMBULATORY_CARE_PROVIDER_SITE_OTHER): Payer: Medicare Other | Admitting: Internal Medicine

## 2011-04-30 VITALS — BP 108/60 | HR 65 | Ht 60.0 in | Wt 134.8 lb

## 2011-04-30 DIAGNOSIS — I10 Essential (primary) hypertension: Secondary | ICD-10-CM

## 2011-04-30 DIAGNOSIS — I2510 Atherosclerotic heart disease of native coronary artery without angina pectoris: Secondary | ICD-10-CM

## 2011-04-30 DIAGNOSIS — R42 Dizziness and giddiness: Secondary | ICD-10-CM

## 2011-04-30 DIAGNOSIS — E785 Hyperlipidemia, unspecified: Secondary | ICD-10-CM

## 2011-04-30 MED ORDER — LISINOPRIL-HYDROCHLOROTHIAZIDE 20-25 MG PO TABS: 1.0000 | ORAL_TABLET | Freq: Every day | ORAL | Status: DC

## 2011-04-30 MED ORDER — NITROGLYCERIN 0.4 MG SL SUBL: 0.4000 mg | SUBLINGUAL_TABLET | SUBLINGUAL | Status: DC | PRN

## 2011-04-30 MED ORDER — AMLODIPINE BESYLATE 10 MG PO TABS: 10.0000 mg | ORAL_TABLET | Freq: Every day | ORAL | Status: DC

## 2011-04-30 MED ORDER — ROSUVASTATIN CALCIUM 20 MG PO TABS: 20.0000 mg | ORAL_TABLET | Freq: Every day | ORAL | Status: DC

## 2011-04-30 NOTE — Assessment & Plan Note (Signed)
Not cardiac. Consider ENT eval.

## 2011-04-30 NOTE — Assessment & Plan Note (Signed)
Blood pressure well controlled. Continue current regimen.  

## 2011-04-30 NOTE — Assessment & Plan Note (Signed)
Lipids look great. Continue current regimen.  

## 2011-04-30 NOTE — Assessment & Plan Note (Addendum)
Doing well. We reviewed results of cath and I reassured her. Suspect Xanax will help. We also discussed fact that she may be getting hypoxemic [place] the mountains and this may exacerbate CP particularly in setting of her anemia. May consider home O2 , if needed (she will try her husband's and see if it helps). Encouraged her to f/u with Dr. Yehuda Budd regarding work-up of anemia with possible colonoscopy +/- referral to hematology.

## 2011-04-30 NOTE — Progress Notes (Signed)
HPI:  [name] is a 77 y/o CAD, HTN, HL, chronic dyspnea//dizziness and diverticular disease s/p partial colon resection. Returns for routine f/u.   In May 2009 underwent heart cath by Dr. Elsie Lincoln for atypical CP and SOB. Non-obstructive disease in RCA and LCX. Had lesion in LAD underwent PCI but had dissection and needed four Promus stents to LAD. Diagonal was compromised. Had had multiple repeat heart catheterization for recurrent CP.   Most recently 2 weeks ago (4/12). showed  1. Coronary artery disease with patent LAD stents with diffuse 90% disease in a moderate-size D2 2. LCx 40% 3. RCA was normal  Treated medically. Doesn't tolerate Imdur well.   Feeling much better[place]  Occasional CP and dyspnea but much improved. BP well controlled.    Recent lipids TC 131 TG 48 HDL 57 LDL 65 remains anemic hgb 9.8. Has refused colonoscopy. Also remains dizzy. Not orthostatic.    ROS: All systems negative except as listed in HPI, PMH and Problem List.  Past Medical History  Diagnosis Date  . Coronary artery disease     s/p PCI of LAD in 5-09 c/b dissection- > promus to LAD x 4  . Hypertension   . Hyperlipidemia   . Syncope and collapse   . Dizziness     chronic  . Breast cancer   . Hypothyroidism   . Lupus     hx of  . Diverticular disease     Current Outpatient Prescriptions  Medication Sig Dispense Refill  . acetic acid-hydrocortisone (VOSOL-HC) otic solution 2 (two) times daily.        Marland Kitchen ALPRAZolam (XANAX) 0.25 MG tablet Take 0.25 mg by mouth at bedtime as needed.        Marland Kitchen amLODipine (NORVASC) 10 MG tablet Take 10 mg by mouth daily.        Marland Kitchen aspirin 81 MG tablet Take 81 mg by mouth daily.        . clopidogrel (PLAVIX) 75 MG tablet Take 75 mg by mouth daily.        . famotidine (RA ACID REDUCER MAX ST) 20 MG tablet Take 20 mg by mouth 2 (two) times daily.        . fluocinonide (LIDEX) 0.05 % cream Apply topically 2 (two) times daily.         . fluticasone (FLONASE) 50 MCG/ACT nasal spray 2 sprays by Nasal route daily.        Marland Kitchen levothyroxine (SYNTHROID, LEVOTHROID) 88 MCG tablet Take 88 mcg by mouth daily.        Marland Kitchen lisinopril-hydrochlorothiazide (PRINZIDE,ZESTORETIC) 20-25 MG per tablet Take 1 tablet by mouth daily.        . nitroGLYCERIN (NITROSTAT) 0.4 MG SL tablet Place 0.4 mg under the tongue every 5 (five) minutes as needed.        . polyvinyl alcohol (LIQUIFILM TEARS) 1.4 % ophthalmic solution 1 drop as needed.        . rosuvastatin (CRESTOR) 20 MG tablet Take 20 mg by mouth daily.        . solifenacin (VESICARE) 5 MG tablet Take 5 mg by mouth daily.       Marland Kitchen DISCONTD: famotidine (PEPCID) 20 MG tablet Take 20 mg by mouth 2 (two) times daily.        Marland Kitchen DISCONTD: levothyroxine (SYNTHROID, LEVOTHROID) 100 MCG tablet Take 100 mcg by mouth daily.        Marland Kitchen DISCONTD: nebivolol (BYSTOLIC) 5 MG tablet  Take by mouth as needed.           PHYSICAL EXAM: Filed Vitals:   04/30/11 1043  BP: 108/60  Pulse: 65   General:  Well appearing. No resp difficulty HEENT: normal Neck: supple. JVP flat. Carotids 2+ bilaterally; no bruits. No lymphadenopathy or thryomegaly appreciated. Cor: PMI normal. Regular rate & rhythm. No rubs, gallops or murmurs. Lungs: clear Abdomen: soft, nontender, nondistended. No hepatosplenomegaly. No bruits or masses. Good bowel sounds. Extremities: no cyanosis, clubbing, rash, edema Neuro: alert & orientedx3, cranial nerves grossly intact. Moves all 4 extremities w/o difficulty. Affect pleasant.    ECG: NSR 65 Anteroseptal Qs. LAFB No ST-T wave abnormalities.     ASSESSMENT & PLAN:

## 2011-04-30 NOTE — Progress Notes (Signed)
Addended byGenice Rouge on: 04/30/2011 11:48 AM   Modules accepted: Orders

## 2011-04-30 NOTE — Patient Instructions (Signed)
Your physician recommends that you schedule a follow-up appointment in: 6 months with Dr Bensimhon  

## 2011-05-01 NOTE — Discharge Summary (Signed)
[name], [name] NO.:  [id number]  MEDICAL RECORD NO.:  [id number]           PATIENT TYPE:  I  LOCATION:  2011                         FACILITY:  [facility]  PHYSICIAN:  Bevelyn Buckles. Ruqayyah Lute, MDDATE OF BIRTH:  01/22/1929  DATE OF ADMISSION:  04/11/2011 DATE OF DISCHARGE:  04/12/2011                              DISCHARGE SUMMARY   PRIMARY CARDIOLOGIST:  Bevelyn Buckles. Ashtynn Berke, MD  PRIMARY CARE PHYSICIAN:  [name], MD  DISCHARGE DIAGNOSES: 1. Chest pain in the setting of high altitude.     a.     The patient may require supplemental oxygen for trips to      high altitude, no evidence of acute coronary syndrome.     b.     Cardiac catheterization, April 11, 2011:  Stents widely      patent in left anterior descending coronary artery, diffusely      diseased somewhat small diagonal branch, unchanged since 2011,      ostium of D2 appears to be less than 2 mm, suspect continued      medical therapy warranted. 2. Anemia, mild.     a.     Mild drop in hemoglobin from 11.1 to 9.8, cath site stable.  SECONDARY DIAGNOSES: 1. Coronary artery disease.     a.     Acute coronary syndrome, May 2009:  Left anterior descending      coronary artery lesion treated with stenting, dissection,      additional stenting with overlapping Palmaz stent.     b.     Right and left cardiac catheterization, March 24, 2010:      Left anterior descending coronary artery 40% in-stent restenosis,      D2 95% (small vessel), obtuse marginal 1 70%, right coronary      artery 20%, left ventricular ejection fraction 75%, PAS 28/2 with      a mean of 14. 2. Hypertension. 3. Hyperlipidemia. 4. Lupus. 5. Hypothyroidism. 6. History of diverticulitis. 7. Systolic ejection murmur.     a.     A 2D echocardiogram, March 24, 2010:  Left ventricular      ejection fraction 55-60%, left atrial enlargement mild, mildly      increased pulmonary systolic pressures (no significant valvular  disease). 8. History of dyspnea on exertion.     a.     S/P cardiopulmonary exercise testing, 2011:  PVO2 18, 106%      of predicted with RAR 1.17, 29, normal spirometry. 9. History of breast cancer.  PAST SURGICAL HISTORY: 1. S/P colon resection. 2. S/P bilateral mastectomy. 3. Appendectomy. 4. Hysterectomy. 5. Multiple cardiac catheterizations as above.  ALLERGIES AND INTOLERANCES: 1. PENICILLINS. 2. CODEINE. 3. SULFA. 4. MACRODANTIN.  PROCEDURES: 1. Chest x-ray, no acute disease. 2. EKG.  NSR, 71 bpm, LAFB, Q-waves in V1 and V2, no prior tracing for     comparison. 3. EKG, April 12, 2011:  No significant change from prior tracing. 4. Left cardiac catheterization, April 11, 2011:  Please see discharge     diagnoses section #1.  HISTORY OF PRESENT ILLNESS:  [name] is an 77 year old  female with above-noted complex medical history, presented in the early morning of April 11, 2011, after being awakened by substernal chest discomfort, radiating to her back, left side, anterior jaw.  She described it as a choking, squeezing, initially 10/10 in severity without change with inspiration or position.  Associated shortness of breath and diaphoresis and nausea, but no vomiting.  Similar to prior angina.  Decrease in severity after 1 sublingual nitroglycerin to 5/10 and then resolved after second nitroglycerin but discomfort returned and EMS contacted. When seen in the emergency department, discomfort 1-2/10 in severity. She has chronic exertional symptoms which have been stable recently but does report some dizziness.  Of note, orthostatics mildly positive at last office visit.  No dizziness currently.  HOSPITAL COURSE:  The patient was admitted and due to high pretest probability and concerning symptoms, underwent diagnostic cardiac catheterization on April 11, 2011 (please see discharge diagnoses #1). Given those findings and relationship of discomfort to altitude,  the patient was deemed stable for discharge in the morning of April 12, 2011, after being seen by her primary cardiologist, Dr. [name].  Of note, she had a mild decrease in her hemoglobin, but cath site was stable and again felt stable for discharge at this time.  No changes to preadmission medication regimen.  At the time of her discharge, the patient received her new medication list, prescriptions, post-cath instructions, and followup instructions.  All questions and concerns were addressed prior to leaving the hospital.  DISCHARGE LABS:  WBC is 6.4, HGB 9.8, HCT 29.9, PLT count 234.  WBC differential was within normal limits on the day of admission except for eosinophils at 7%.  ProTime 14.9, INR 1.06.  Sodium 139, potassium 3.5, chloride 103, bicarb 30, BUN 8, creatinine 0.58, glucose 89, calcium 8.7.  Point-of-care markers negative x1.  FOLLOWUP PLANS AND APPOINTMENTS:  Dr. [name] at [facility] office, April 30, 2011, at 10:30 a.m.  DISCHARGE MEDICATIONS: 1. Acetic acid/hydrocortisone ear drops 1-2 drops in each ear daily. 2. Artificial tears 1 drop both eyes q.i.d. 3. Enteric-coated aspirin 81 mg p.o. at bedtime. 4. Clobetasol 0.05% cream 1 application topically daily. 5. Crestor 20 mg 1 tablet daily. 6. Famotidine 20 mg 1 tablet p.o. b.i.d. 7. Lisinopril/HCTZ 20/25 mg 1 tablet p.o. q.a.m. 8. Norvasc 10 mg p.o. q.a.m. 9. Plavix 75 mg 1 tablet p.o. q.a.m. 10.Synthroid 100 mcg p.o. daily. 11.APAP 500 mg 1-2 tabs q.6 hours p.r.n. 12.VESIcare 5 mg 1 tablet p.o. q.a.m.  DURATION OF DISCHARGE ENCOUNTER:  Including physician time was 35 minutes.     Jarrett Ables, PAC   ______________________________ Bevelyn Buckles. Saida Lonon, MD    MS/MEDQ  D:  04/12/2011  T:  04/12/2011  Job:  [id number]  cc:   [name], M.D.  Electronically Signed by Jarrett Ables PAC on 04/16/2011 04:03:06 PM Electronically Signed by [name] MD on  05/01/2011 07:13:38 PM

## 2011-05-15 ENCOUNTER — Other Ambulatory Visit (HOSPITAL_COMMUNITY): Payer: Self-pay | Admitting: Oncology

## 2011-05-15 ENCOUNTER — Encounter (HOSPITAL_BASED_OUTPATIENT_CLINIC_OR_DEPARTMENT_OTHER): Payer: Medicare Other | Admitting: Oncology

## 2011-05-15 DIAGNOSIS — R42 Dizziness and giddiness: Secondary | ICD-10-CM

## 2011-05-15 DIAGNOSIS — D0590 Unspecified type of carcinoma in situ of unspecified breast: Secondary | ICD-10-CM

## 2011-05-15 DIAGNOSIS — D649 Anemia, unspecified: Secondary | ICD-10-CM

## 2011-05-15 DIAGNOSIS — M329 Systemic lupus erythematosus, unspecified: Secondary | ICD-10-CM

## 2011-05-15 LAB — CHCC SMEAR

## 2011-05-15 LAB — CBC WITH DIFFERENTIAL/PLATELET
BASO%: 0.7 % (ref 0.0–2.0)
Basophils Absolute: 0.1 10*3/uL (ref 0.0–0.1)
HCT: 31.2 % — ABNORMAL LOW (ref 34.8–46.6)
HGB: 10.9 g/dL — ABNORMAL LOW (ref 11.6–15.9)
MCHC: 35 g/dL (ref 31.5–36.0)
MONO#: 0.5 10*3/uL (ref 0.1–0.9)
NEUT%: 57.5 % (ref 38.4–76.8)
RDW: 13.8 % (ref 11.2–14.5)
WBC: 6.7 10*3/uL (ref 3.9–10.3)
lymph#: 2.1 10*3/uL (ref 0.9–3.3)

## 2011-05-15 LAB — MORPHOLOGY

## 2011-05-15 NOTE — Cardiovascular Report (Signed)
[name], [name] NO.:  [id number]   MEDICAL RECORD NO.:  [id number]          PATIENT TYPE:  INP   LOCATION:  2914                         FACILITY:  [facility]   PHYSICIAN:  Madaline Savage, M.D.DATE OF BIRTH:  [birth year]   DATE OF PROCEDURE:  05/12/2008  DATE OF DISCHARGE:                            CARDIAC CATHETERIZATION   PROCEDURES PERFORMED:  1. Selective coronary angiography by Judkins technique.  2. Retrograde left heart catheterization.  3. Left ventricular angiography.  4. Percutaneous coronary artery stenting of the proximal and mid left      anterior descending coronary artery.   COMPLICATIONS:  Linear dissection of the mid LAD requiring additional  stents.   MEDICATIONS:  Intracoronary artery adenosine, intracoronary artery  nitro, Integrilin, heparin, Pepcid, and Plavix.   PATIENT PROFILE:  The patient is a 77 year old Hispanic lady who has had  a week's worth of stable angina.  She came to the cardiac cath lab for  diagnostic cardiac catheterization and the findings of that  catheterization were as follows:  1. Right coronary artery luminal irregularities only dominant right      coronary system.  2. Left main coronary artery large and dominant, no lesions.  Left      circumflex coronary artery nondominant with no significant disease.  3. Left anterior descending coronary artery problematic vessel      eccentric stenosis in LAD just after septal perforator branch      eccentric with calcification.  4. Diagonal branch of LAD proximally stenosis distal vessel small, no      intervention performed.   The percutaneous intervention of the left anterior descending coronary  artery was ultimately successful with four overlapping 2.5 mm Palmaz  stents of varying lengths adding up to 57 mm of overlapping, Palmaz  stent intervention.  The additional Palmaz stents were required after  the vessel linearly dissected and I had to keep adding more  stent  segments to cover the dissection.  The patient had TIMI 3 flow at the  beginning of the case and TIMI 3 flow at the end.   FINAL IMPRESSION:  Successful LAD stenting with four overlapping Palmaz  stents.  Normal LVEF ejection fraction 60%.           ______________________________  Madaline Savage, M.D.     WHG/MEDQ  D:  05/12/2008  T:  05/13/2008  Job:  [id number]   cc:   [facility]

## 2011-05-15 NOTE — Discharge Summary (Signed)
[name], [name] NO.:  [id number]   MEDICAL RECORD NO.:  [id number]          PATIENT TYPE:  INP   LOCATION:  2923                         FACILITY:  [facility]   PHYSICIAN:  Nanetta Batty, M.D.   DATE OF BIRTH:  09/17/1929   DATE OF ADMISSION:  05/11/2008  DATE OF DISCHARGE:  05/15/2008                               DISCHARGE SUMMARY   HISTORY OF PRESENT ILLNESS:  Ms. Rubendall is a 77 year old [place] female  who came to the emergency room with exertional chest pain.  She has also  chest pain awakening her at night.  This has been going on for  approximately 2 months.  She was seen in the emergency room by Dr.  Nanetta Batty and admitted, put on IV heparin with plans for her to  undergo cardiac cath.  She ruled out for an MI.  Her cath showed she had  a high-grade lesion in her LAD.  She had LAD stenting with permanent  stenting.  She had a mid LAD dissection that was also covered with  permanent stents.  She had 4 stents overlapping 2.5 x 15, 2.5 x 12, 2.5  x 12, and 2.5 x 18.  That evening, she had some chest discomfort.  She  did not have any enzyme bump or EKG/ischemic changes.  She was seen by  cardiac rehab.  She was walked in the hall.  She thought she had a  urinary tract infection.  She was started on Macrodantin.  Urinalysis  was obtained this went up being negative for any urinary tract  infection.  She was seen by Dr. Allyson Sabal again on May 15, 2008.  She  thought to be stable and discharged to home.   PHYSICAL EXAMINATION:  VITAL SIGNS:  Blood pressure was 120/50 and pulse  was 56 on the day of discharge.   LABORATORY DATA:  Sodium 142, potassium 4.3, chloride 105, CO2 31, BUN  11, creatinine 0.68, and glucose 92.  CBC showed a hemoglobin 10.0,  hematocrit 29.5, WBC 5.9, and platelets 252.  TSH was 2.098.  Her  troponins were negative.  Pre-procedure and post-procedure, she had a  little bit of elevation with her CK-MBs were all negative.  UA did not  show any infection.  Chest x-ray showed no acute chest findings.   DISCHARGE MEDICATIONS:  1. Antivert p.r.n.  2. Cymbalta 30 mg 1 at bedtime.  3. Fioricet as needed.  4. Norvasc 10 mg daily.  5. Singulair 10 mg as needed.  6. Synthroid 75 mcg a day.  7. Toprol-XL 50 mg a day.  8. We added Crestor 20 mg a day.  9. Aspirin 81 mg 2 per day.  10.Plavix 75 mg daily.  She was told not to stop this.  11.Pepcid 20 mg twice per day.  12.Nitroglycerin 1/150 under tongue p.r.n. as needed for chest pain.   DISCHARGE DIAGNOSES:  1. Unstable angina.  2. Status post cath with LAD disease with stenting to her LAD with a      problem of stent.  She had some dissection that she had 4  overlapping stents placed.  Residual disease 85% in her diagonals.  3. Normal EF of 50%.  4. Hypertension.  5. Hyperlipidemia with LDL in the 190.  6. Hypothyroidism.  7. Suspected urinary tract infection, culture not done because the      microscopic urine was not positive as she was treated with      Macrodantin x2 days in the hospital.  8. History of headaches.  9. History of colon resection for diverticulitis.  10.History of breast cancer, bilateral mastectomy, no radiation or      chemotherapy.      Lezlie Octave, N.P.      Nanetta Batty, M.D.  Electronically Signed    BB/MEDQ  D:  05/15/2008  T:  05/16/2008  Job:  [id number]   cc:   [name], M.D.

## 2011-05-17 LAB — FOLATE RBC: RBC Folate: 814 ng/mL (ref >=366)

## 2011-05-17 LAB — IMMUNOFIXATION ELECTROPHORESIS
IgA: 154 mg/dL (ref 68–378)
IgG (Immunoglobin G), Serum: 995 mg/dL (ref 694–1618)

## 2011-05-17 LAB — IRON AND TIBC
%SAT: 27 % (ref 20–55)
Iron: 86 ug/dL (ref 42–145)

## 2011-05-17 LAB — COMPREHENSIVE METABOLIC PANEL
ALT: 11 U/L (ref 0–35)
AST: 20 U/L (ref 0–37)
Albumin: 4.4 g/dL (ref 3.5–5.2)
Alkaline Phosphatase: 47 U/L (ref 39–117)
Potassium: 3.7 mEq/L (ref 3.5–5.3)
Sodium: 138 mEq/L (ref 135–145)
Total Protein: 6.7 g/dL (ref 6.0–8.3)

## 2011-05-17 LAB — VITAMIN B12: Vitamin B-12: 359 pg/mL (ref 211–911)

## 2011-05-18 NOTE — Discharge Summary (Signed)
[name], [name]               ACCOUNT NO.:  [id number]   MEDICAL RECORD NO.:  [id number]          PATIENT TYPE:  INP   LOCATION:  5715                         FACILITY:  [facility]   PHYSICIAN:  Ollen Gross. Vernell Morgans, M.D. DATE OF BIRTH:  1929/09/23   DATE OF ADMISSION:  10/09/2006  DATE OF DISCHARGE:  10/11/2006                               DISCHARGE SUMMARY   HISTORY OF PRESENT ILLNESS:  Ms. [name] is a 77 year old woman who was  brought to the operating room on October 09, 2006 for bilateral  mastectomies because of left breast extensive DCIS.  She tolerated the  surgery well.  Postoperatively, her drains were in good position.  Her  main issue was pain control and understanding how to take care of her  drains.  This required an extra day in the hospital and on October 11, 2006, she was tolerating diet, pain was under better control, she  understood how to take care of her drains and she was ready for  discharge home.   DISCHARGE MEDICATIONS:  She was to resume her home medications.  She was  given a prescription for Vicodin for pain.   ACTIVITY:  No heavy lifting.   DIET:  As tolerated.   CONDITION ON DISCHARGE:  Stable and the patient is discharged home.      Ollen Gross. Vernell Morgans, M.D.  Electronically Signed     PST/MEDQ  D:  02/11/2007  T:  02/12/2007  Job:  [id number]

## 2011-05-18 NOTE — Op Note (Signed)
[name], [name]               ACCOUNT NO.:  [id number]   MEDICAL RECORD NO.:  [id number]          PATIENT TYPE:  AMB   LOCATION:  DSC                          FACILITY:  [facility]   PHYSICIAN:  Ollen Gross. Vernell Morgans, M.D. DATE OF BIRTH:  1929/07/14   DATE OF PROCEDURE:  09/20/2006  DATE OF DISCHARGE:  09/20/2006                                 OPERATIVE REPORT   PREOPERATIVE DIAGNOSIS:  Left breast atypical ductal hyperplasia.   POSTOPERATIVE DIAGNOSIS:  Left breast atypical ductal hyperplasia.   PROCEDURE:  Left breast needle-localized lumpectomy.   SURGEON:  Ollen Gross. Carolynne Edouard, M.D.   ANESTHESIA.:  General via LMA.   PROCEDURE:  After informed consent was obtained, the patient was brought to  the operating room and placed in the supine position on the operating room  table, where after adequate induction of general anesthesia, the patient's  left breast was prepped with Betadine and draped in the usual sterile  manner.  Earlier in the day the patient had undergone a needle localization  procedure and the needle was entering the breast medially.  The position of  the needle was then the upper central portion of the left breast.  A small  curvilinear incision was made just lateral to the wire.  This incision was  carried down through the skin and subcutaneous tissue sharply with the  electrocautery until the breast tissue was entered.  The course of the wire  was identified and a spherical portion of breast tissue around the path of  the wire was removed sharply.  This was removed sharply with the  electrocautery.  Once this was removed, the specimen was marked with the  long stitch being lateral and the short stitch being superior and was sent  to radiology and pathology.  The wound was irrigated with copious amounts of  saline.  Hemostasis was achieved using the Bovie electrocautery.  The deep  layer of the wound was then closed with interrupted 3-0 Vicryl stitches and  the skin was  closed with a running 4-0 Monocryl subcuticular stitch.  The  wound was infiltrated with 0.25% Marcaine.  Benzoin and Steri-Strips and  sterile dressings were applied.  The patient tolerated the procedure well.  At the end of the case all needle, sponge and instrument counts correct.  The patient was then awakened and taken to recovery in stable condition.      Ollen Gross. Vernell Morgans, M.D.  Electronically Signed     PST/MEDQ  D:  09/22/2006  T:  09/24/2006  Job:  [id number]

## 2011-05-18 NOTE — Assessment & Plan Note (Signed)
[facility]                             PULMONARY OFFICE NOTE   [name], [name]                    MRN:          [id number]  DATE:11/27/2006                            DOB:          1929-12-15    REFERRING PHYSICIAN:  [name], M.D.   She is seen at Dr. Alda Berthold request.   REASON FOR CONSULTATION:  Cough.   HISTORY:  [age]-year-old white female with a cough dating back  over a year that seems worse when she tries to lie down at night, but  also this wakes her from her sleep.  It is not typically exacerbated in  the morning or associated with excessive sputum production, itching,  sneezing or wheezing.   The patient does have a history of hypertension and has been on multiple  antihypertensives including Lotrel, which was stopped about 10 days ago,  but is no better.  She has also been treated with various decongestants  (she cannot name them) and inhalers as well as reflux medications  without any benefit at all.   PAST MEDICAL HISTORY:  1. Significant for the diagnosis of breast cancer, status post      bilateral mastectomy in October 2007 by Dr. Carolynne Edouard with no radiation      or oncology therapy needed.  2. She has a history of sinus surgery as well as hysterectomy      remotely.   There is no previous history of any significant asthma or sinus  problems.   ALLERGIES:  PENICILLIN, SULFA, ASPIRIN and CODEINE, all with nonspecific  reactions.   MEDICATIONS:  Cymbalta, Synthroid, Norvasc, Toprol, fluticasone nasal  spray and Zyrtec; see column dated November 27, 2006 for specifics.   SOCIAL HISTORY:  She has never smoked and denies any unusual travel, pet  or hobby exposure.   FAMILY HISTORY:  Significant for the breast cancer only with no  respiratory disease or atopy.   REVIEW OF SYSTEMS:  Taken in detail on the work sheet and significant  for problems as outlined above.   PHYSICAL EXAMINATION:  This is a pleasant,  ambulatory white female who  clears her throat frequently during interview and exam.  HEENT:  Normal with no evidence of turbinate edema and oropharynx is  clear with no evidence of postnasal drainage or cobblestoning.  Dentition is intact.  Ear canals are clear bilaterally.  Lung fields were perfectly clear bilaterally to auscultation and  percussion with no cough elicited on inspiratory or expiratory  maneuvers.  There is a regular rate and rhythm without murmur, gallop or rub  present.  ABDOMEN:  Soft and benign.  EXTREMITIES:  Warm without calf tenderness, cyanosis, clubbing or edema.   CHEST X-RAY:  Requested.  (She says she has had x-rays at [facility],  but we did not find any in the E-chart system.)   IMPRESSION:  Classic upper airway cough that probably was not caused by  ACE inhibitors, but promoted by ACE inhibitors.  The most likely  mechanism is probably reflux if rhinitis has been excluded by failure to  respond with Zyrtec.  I explained this to the patient and also recommended and emphasized that  she should maintain off of ACE inhibitors and expect to improve over the  next 4 weeks.  In the meantime, I recommended added Zegerid 40 mg at  bedtime, Reglan 10 mg at bedtime and Mepergan Forte one nightly to give  her a good night's sleep and prevent cyclical coughing.  I also reviewed  with her a gastroesophageal reflux disease diet.   If the cough resolves, nothing further needs to be done.  Otherwise, a  sinus CT scan is the next logical step and I have given her the phone  number to call for followup.     Charlaine Dalton. Sherene Sires, MD, [facility]  Electronically Signed    MBW/MedQ  DD: 11/27/2006  DT: 11/28/2006  Job #: [id number]   cc:   [name], M.D.

## 2011-05-18 NOTE — Assessment & Plan Note (Signed)
[facility]                         GASTROENTEROLOGY OFFICE NOTE   JONDA, ALANIS                    MRN:          [id number]  DATE:04/21/2007                            DOB:          October 03, 1929    REFERRING PHYSICIAN:  [name], M.D.   REASON FOR REFERRAL:  Dr. Collins Scotland asked me to evaluate Ms. Deramirez in  consultation regarding a history of colon resection, colorectal cancer  screening, dyspepsia.   HISTORY OF PRESENT ILLNESS:  Ms. Nesheim is a very pleasant, 77-year-  old woman who underwent what sounds like a left segmental colectomy 8  years ago in [place] for a complicated diverticulitis. She had what  sounds like a primary anastomosis without a colostomy bag. She did not  have a colonoscopy prior to the surgery and has had none since. She does  have mild left lower quadrant discomforts that come and go. She thinks  this did start around the time of her surgery and her diverticulitis  prior to the surgery. She has no overt GI bleeding. She also has upper  GI bloating and discomfort. She eats a lot of fruits and vegetables and  has not tried cutting back on those to see if it makes a difference. She  has tried Gas X in the past and it does help but she does not like  taking pills on a daily basis.   REVIEW OF SYSTEMS:  Notable for a 10 pound weight gain in the past few  months. The rest of her review of systems is essentially normal and is  available on her nursing intake sheet.   PAST MEDICAL HISTORY:  Left-sided mastectomy October 2007, hysterectomy,  appendectomy. In 1978, she was diagnosed with lupus. Left-sided  segmental colectomy 2000, done in [place] for diverticular disease.   CURRENT MEDICATIONS:  Cymbalta, Synthroid, Norvasc, Toprol, fluticasone.   ALLERGIES:  SULFA, ASPIRIN, CODEINE and PENICILLIN.   SOCIAL HISTORY:  Nonsmoker. She drinks occasional wine.   FAMILY HISTORY:  No colon cancer or colon polyps in  the family.   PHYSICAL EXAMINATION:  VITAL SIGNS:  4 feet 11 inches, 138 pounds, blood  pressure 120/78, pulse 68.  CONSTITUTIONAL:  Generally well appearing.  NEUROLOGIC:  Alert and oriented x3.  EYES:  Extraocular movements intact.  MOUTH:  Oropharynx moist, no lesions.  NECK:  Supple, no lymphadenopathy.  CARDIOVASCULAR:  Heart regular rate and rhythm.  LUNGS:  Clear to auscultation bilaterally.  ABDOMEN:  Soft, nontender, nondistended, normal bowel sounds.  EXTREMITIES:  No lower extremity edema.  SKIN:  No rashes or lesions on visible extremities.   ASSESSMENT/PLAN:  A 77 year old woman at routine risk for colorectal  cancer.   We should proceed with full colonoscopy at her soonest convenience as  she has never had one in the past. Her mild left-sided discomforts are  probably related to her segmental colectomy with some mild scar tissue.  They do not seem to be too bothersome and I think just simply observing  her clinically is reasonable. For her upper gastrointestinal  discomforts, I recommended she get back on the Gas X since this  did help  her in the past, she just is hesitant to take pills on a daily basis. I  see no reason for any further blood test or imaging studies prior to her  colonoscopy.     Rachael Fee, MD  Electronically Signed    DPJ/MedQ  DD: 04/21/2007  DT: 04/21/2007  Job #: [id number]   cc:   [name], M.D.

## 2011-05-18 NOTE — Op Note (Signed)
[name], [name]               ACCOUNT NO.:  [id number]   MEDICAL RECORD NO.:  [id number]          PATIENT TYPE:  INP   LOCATION:  5715                         FACILITY:  [facility]   PHYSICIAN:  Ollen Gross. Vernell Morgans, M.D. DATE OF BIRTH:  1929-03-19   DATE OF PROCEDURE:  10/09/2006  DATE OF DISCHARGE:  10/11/2006                               OPERATIVE REPORT   PREOPERATIVE DIAGNOSIS:  Diffuse ductal carcinoma in situ in the left  breast.   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   PROCEDURE:  Bilateral mastectomies and left sentinel lymph node biopsy.   SURGEON:  Dr. Carolynne Edouard   ASSISTANT:  No assistant.   ANESTHESIA:  General endotracheal.   PROCEDURE:  After informed consent was obtained, the patient was brought  to the operating room and placed in a supine position on the operating  room table.  After adequate induction of general anesthesia, the  patient's chest and axilla on the left were prepped with Betadine and  draped in the usual sterile manner.  The patient had previously  undergone radionucleotide injection with 1 mCi of technetium sulfur  colloid in the subareolar area of the left breast.  The left axilla was  checked with the NeoProbe, and a hot spot was identified.  Two mL of  methylene blue and 3 mL of saline were injected in the subareolar area  of the left breast.  The tissue was massaged for several minutes.  Next,  a small transverse incision was made in the left axilla overlying the  hot spot.  This incision was carried down through the skin and  subcutaneous tissue sharply with the electrocautery until the axilla was  entered.  The direction of the dissection was then determined using the  NeoProbe, and blunt dissection with a hemostat was undertaken in the  left axilla until a hot blue lymph node was identified.  This was  dissected by a combination of sharp dissection with the electrocautery  and some blunt hemostat  dissection.  The lymphatics were clamped with  hemostats, divided, and ligated with 3-0 Vicryl ties.  The ex vivo  counts on the first lymph node were around 500.  It was sent as sentinel  node #1.  Next to this was another palpable lymph node that was neither  hot nor blue but was palpable, and this was also excised by a  combination of sharp dissection with the electrocautery.  The lymphatics  were then clamped with hemostats, divided, and ligated with 3-0 Vicryl  ties, and this was sent as sentinel node number 2.  A third hot blue  lymph node was also identified by blunt dissection and again removed by  a combination of sharp Bovie dissection and blunt hemostat dissection.  Lymphatics were clamped with hemostats, divided, and ligated with 3-0  Vicryl ties.  The ex vivo counts on the third lymph node were around  150.  These were all sent to pathology for touch preps which were all  negative.  The axilla was then examined and found to be hemostatic.  The  deep layer of the axilla was closed with interrupted 3-0 Vicryl  stitches, and the skin was closed with running 4-0 Monocryl subcuticular  stitch.  Attention was then turned to the right breast.  An elliptical  incision was made to include the nipple areolar complex from the edge of  the axilla sort of along the anterior axillary line and towards the  sternum.  This was done to try to remove enough skin to leave a fairly  flat, well-approximated incision along the muscle.  Once the incision  was made, subcutaneous flaps were created both superiorly and inferiorly  by sharp dissection with the electrocautery.  Skin hooks were used to  hold the skin edges anteriorly towards the ceiling while this was being  done.  This subcutaneous flap was dissected down to the chest wall.  Once down to the chest wall, the breast was dissected off the chest wall  with the electrocautery including the pectoralis fascia.  The dissection  was carried  laterally to the anterior axillary line.  Once the  dissection encountered, the intercostobrachial nerves, that was  determined to be the lateral edge of the breast, and the tissue was  dissected off the chest wall at this point.  This removed the breast  from the patient.  The breast was oriented with a stitch on the lateral  segment and sent to pathology for further evaluation.  A single stab  incision was made with a 15 blade knife below the dissection area  laterally.  A tonsil clamp was placed through this opening, and a 19-  [place] round Blake drain was brought through the skin and laid along the  chest wall.  The superior and inferior flap were anchored to the  pectoralis muscle in several places with interrupted 3-0 Vicryl  stitches.  The skin edges were reapproximated with interrupted 3-0  Vicryl stitches in the subcutaneous tissue, and then staples were  applied.  The drain was placed to suction; the skin flaps appeared to be  healthy without any tension and nicely approximated in the chest wall.  This was then covered with a sterile green towel.  Attention was then  turned to her left breast.  Again, a similar elliptical incision was  made in a symmetric fashion from the edge of the sternum towards the  anterior axillary line laterally.  This elliptical incision was made to  include the nipple-areolar complex as well as the previous breast biopsy  incision.  The skin hooks were then used to elevate the skin anteriorly  while subcutaneous flaps were created superiorly and inferiorly down to  the chest wall.  Once down to the chest wall, the breast was removed  from the chest wall with the pectoralis fascia by sharp dissection with  the electrocautery.  The dissection was carried laterally until the  intercostobrachial nerves were encountered.  This was the lateral edge  of the breast tissue, and the dissection was carried down to the chest wall at this point.  The breast was then  removed from the patient and  marked with a stitch on the lateral segment and sent to pathology.  Both  wounds were irrigated with saline and found to be hemostatic.  A small  stab incision was made laterally and inferiorly to the dissection area.  A tonsil clamp was placed through this opening and a 19-[place] round  Blake drain was brought through the skin.  The drain was laid on the  chest wall.  The flaps, both superiorly and inferiorly, were anchored to  the pectoralis muscle in several places with interrupted 3-0 Vicryl  stitches.  Edges of the skin flaps were also reapproximated with  interrupted 3-0 Vicryl stitches and the subcutaneous tissue, and then  staples were used to close the skin.  Once this was accomplished, the  skin flaps appeared to be healthy and well approximated.  The drains  were placed to suction; sterile dressings were applied.  The patient  tolerated the procedure well.  At the end of the case, all needle,  sponge, and instrument counts correct.  The patient was then awakened  and taken to recovery in stable condition.      Ollen Gross. Vernell Morgans, M.D.  Electronically Signed     PST/MEDQ  D:  10/14/2006  T:  10/14/2006  Job:  [id number]

## 2011-06-11 ENCOUNTER — Other Ambulatory Visit: Payer: Self-pay | Admitting: *Deleted

## 2011-06-11 DIAGNOSIS — I10 Essential (primary) hypertension: Secondary | ICD-10-CM

## 2011-06-11 MED ORDER — LISINOPRIL-HYDROCHLOROTHIAZIDE 20-25 MG PO TABS: 1.0000 | ORAL_TABLET | Freq: Every day | ORAL | Status: DC

## 2011-06-19 ENCOUNTER — Other Ambulatory Visit (HOSPITAL_COMMUNITY): Payer: Self-pay | Admitting: Oncology

## 2011-06-19 LAB — FECAL OCCULT BLOOD, GUAIAC: Occult Blood: NEGATIVE

## 2011-07-27 ENCOUNTER — Other Ambulatory Visit: Payer: Self-pay | Admitting: *Deleted

## 2011-07-27 MED ORDER — CLOPIDOGREL BISULFATE 75 MG PO TABS: 75.0000 mg | ORAL_TABLET | Freq: Every day | ORAL | Status: DC

## 2011-08-17 ENCOUNTER — Other Ambulatory Visit (HOSPITAL_COMMUNITY): Payer: Self-pay | Admitting: Oncology

## 2011-08-17 ENCOUNTER — Encounter (HOSPITAL_BASED_OUTPATIENT_CLINIC_OR_DEPARTMENT_OTHER): Payer: Medicare Other | Admitting: Oncology

## 2011-08-17 DIAGNOSIS — D0590 Unspecified type of carcinoma in situ of unspecified breast: Secondary | ICD-10-CM

## 2011-08-17 DIAGNOSIS — M329 Systemic lupus erythematosus, unspecified: Secondary | ICD-10-CM

## 2011-08-17 DIAGNOSIS — D649 Anemia, unspecified: Secondary | ICD-10-CM

## 2011-08-17 DIAGNOSIS — C50919 Malignant neoplasm of unspecified site of unspecified female breast: Secondary | ICD-10-CM

## 2011-08-17 DIAGNOSIS — R42 Dizziness and giddiness: Secondary | ICD-10-CM

## 2011-08-17 LAB — CBC WITH DIFFERENTIAL/PLATELET
BASO%: 0.7 % (ref 0.0–2.0)
Basophils Absolute: 0 10*3/uL (ref 0.0–0.1)
EOS%: 2.9 % (ref 0.0–7.0)
HGB: 11.1 g/dL — ABNORMAL LOW (ref 11.6–15.9)
MCH: 30.6 pg (ref 25.1–34.0)
RDW: 13.4 % (ref 11.2–14.5)
lymph#: 2.1 10*3/uL (ref 0.9–3.3)

## 2011-08-18 LAB — COMPREHENSIVE METABOLIC PANEL WITH GFR
ALT: 10 U/L (ref 0–35)
AST: 18 U/L (ref 0–37)
Albumin: 4.6 g/dL (ref 3.5–5.2)
Alkaline Phosphatase: 49 U/L (ref 39–117)
BUN: 24 mg/dL — ABNORMAL HIGH (ref 6–23)
CO2: 27 meq/L (ref 19–32)
Calcium: 9.9 mg/dL (ref 8.4–10.5)
Chloride: 102 meq/L (ref 96–112)
Creatinine, Ser: 0.79 mg/dL (ref 0.50–1.10)
Glucose, Bld: 99 mg/dL (ref 70–99)
Potassium: 4.4 meq/L (ref 3.5–5.3)
Sodium: 139 meq/L (ref 135–145)
Total Bilirubin: 0.4 mg/dL (ref 0.3–1.2)
Total Protein: 7 g/dL (ref 6.0–8.3)

## 2011-08-18 LAB — IGG, IGA, IGM
IgA: 161 mg/dL (ref 69–380)
IgG (Immunoglobin G), Serum: 1040 mg/dL (ref 690–1700)
IgM, Serum: 122 mg/dL (ref 52–322)

## 2011-08-18 LAB — SEDIMENTATION RATE: Sed Rate: 11 mm/h (ref 0–22)

## 2011-08-18 LAB — LACTATE DEHYDROGENASE: LDH: 196 U/L (ref 94–250)

## 2011-09-12 ENCOUNTER — Ambulatory Visit (INDEPENDENT_AMBULATORY_CARE_PROVIDER_SITE_OTHER): Payer: Medicare Other | Admitting: Cardiology

## 2011-09-12 ENCOUNTER — Encounter: Payer: Self-pay | Admitting: Cardiology

## 2011-09-12 DIAGNOSIS — I2510 Atherosclerotic heart disease of native coronary artery without angina pectoris: Secondary | ICD-10-CM

## 2011-09-12 DIAGNOSIS — E785 Hyperlipidemia, unspecified: Secondary | ICD-10-CM

## 2011-09-12 DIAGNOSIS — I10 Essential (primary) hypertension: Secondary | ICD-10-CM

## 2011-09-12 DIAGNOSIS — R011 Cardiac murmur, unspecified: Secondary | ICD-10-CM

## 2011-09-12 MED ORDER — CLOPIDOGREL BISULFATE 75 MG PO TABS: 75.0000 mg | ORAL_TABLET | Freq: Every day | ORAL | Status: DC

## 2011-09-12 NOTE — Assessment & Plan Note (Signed)
I reviewed her previous catheterization notes. She has 90% diagonal stenosis and a previous stent but this is a small vessel to be managed medically. She will continue on Plavix and other meds as listed.

## 2011-09-12 NOTE — Assessment & Plan Note (Signed)
No further cardiovascular testing is indicated.  I reviewed the echo from last year and she had no significant structural heart disease.

## 2011-09-12 NOTE — Assessment & Plan Note (Signed)
I reviewed her lipids from earlier this year and they were excellent.  She will continue meds as listed.

## 2011-09-12 NOTE — Assessment & Plan Note (Signed)
The blood pressure is at target. No change in medications is indicated. We will continue with therapeutic lifestyle changes (TLC).  

## 2011-09-12 NOTE — Progress Notes (Signed)
HPI The patient presents for evaluation of CAD.  She was seeing Dr. Gala Romney.  Since last being seen she has done well.  She is under stress caring for husband in hospice. She rarely needs to take a nitroglycerin. She occasionally gets some chest discomfort with emotional stress. However she can push a vacuum and walked the dog without bringing on symptoms. She is not describing any new shortness of breath, PND or orthopnea. She's had no new palpitations, presyncope or syncope. She did have a cat earlier this year.  Allergies  Allergen Reactions  . Codeine   . Diazepam     REACTION: nightmares  . Latex   . Macrobid     Hives, rash  . Penicillins   . Sulfonamide Derivatives     Current Outpatient Prescriptions  Medication Sig Dispense Refill  . acetic acid-hydrocortisone (VOSOL-HC) otic solution 2 (two) times daily.        Marland Kitchen ALPRAZolam (XANAX) 0.25 MG tablet Take 0.25 mg by mouth at bedtime as needed.        Marland Kitchen amLODipine (NORVASC) 10 MG tablet Take 1 tablet (10 mg total) by mouth daily.  30 tablet  11  . aspirin 81 MG tablet Take 81 mg by mouth daily.        . clopidogrel (PLAVIX) 75 MG tablet Take 1 tablet (75 mg total) by mouth daily.  30 tablet  6  . doxycycline (DORYX) 100 MG EC tablet Take 100 mg by mouth 2 (two) times daily.        . fluocinonide (LIDEX) 0.05 % cream Apply topically 2 (two) times daily.        . fluticasone (FLONASE) 50 MCG/ACT nasal spray 2 sprays by Nasal route daily.        Marland Kitchen levothyroxine (SYNTHROID, LEVOTHROID) 88 MCG tablet Take 88 mcg by mouth daily.        Marland Kitchen lisinopril-hydrochlorothiazide (PRINZIDE,ZESTORETIC) 20-25 MG per tablet Take 1 tablet by mouth daily.  30 tablet  11  . nitroGLYCERIN (NITROSTAT) 0.4 MG SL tablet Place 1 tablet (0.4 mg total) under the tongue every 5 (five) minutes as needed.  25 tablet  6  . omeprazole (PRILOSEC) 20 MG capsule Take 20 mg by mouth daily.        . polyvinyl alcohol (LIQUIFILM TEARS) 1.4 % ophthalmic solution 1 drop as  needed.        . prednisoLONE (PRELONE) 15 MG/5ML syrup Take 1 mg/kg by mouth daily.        . rosuvastatin (CRESTOR) 20 MG tablet Take 1 tablet (20 mg total) by mouth daily.  30 tablet  11  . solifenacin (VESICARE) 5 MG tablet Take 5 mg by mouth daily.         Past Medical History  Diagnosis Date  . Coronary artery disease     s/p PCI of LAD in 5-09 c/b dissection- > promus to LAD x 4  . Hypertension   . Hyperlipidemia   . Syncope and collapse   . Dizziness     chronic  . Breast cancer   . Hypothyroidism   . Lupus     hx of  . Diverticular disease     Past Surgical History  Procedure Date  . Cardiac catheterization 4-11    patent LAD stents. 70% OM lesion, normal EF and RH pressures  . Mastectomy     bilateral  . Colon resection     partial  . Abdominal hysterectomy   . Appendectomy  ROS:  Back, left hip pain.  Otherwise as stated in the HPI and negative for all other systems.  PHYSICAL EXAM BP 112/66  Pulse 93  Resp 16  Ht 4\' 9"  (1.448 m)  Wt 135 lb (61.236 kg)  BMI 29.21 kg/m2 GENERAL:  Well appearing HEENT:  Pupils equal round and reactive, fundi not visualized, oral mucosa unremarkable NECK:  No jugular venous distention, waveform within normal limits, carotid upstroke brisk and symmetric, no bruits, no thyromegaly LYMPHATICS:  No cervical, inguinal adenopathy LUNGS:  Clear to auscultation bilaterally BACK:  No CVA tenderness, lordosis CHEST:  Bilateral mastectomy HEART:  PMI not displaced or sustained,S1 and S2 within normal limits, no S3, no S4, no clicks, no rubs, soft apical systolic murmur ABD:  Flat, positive bowel sounds normal in frequency in pitch, no bruits, no rebound, no guarding, no midline pulsatile mass, no hepatomegaly, no splenomegaly EXT:  2 plus pulses throughout, no edema, no cyanosis no clubbing SKIN:  No rashes no nodules NEURO:  Cranial nerves II through XII grossly intact, motor grossly intact throughout PSYCH:  Cognitively intact,  oriented to person place and time  EKG:  Sinus rhythm, rate 93, left axis deviation, left anterior fascicular block, poor anterior R-wave progression, old anteroseptal infarct, and QTC prolonged, no change from previous.  ASSESSMENT AND PLAN

## 2011-09-12 NOTE — Progress Notes (Signed)
Addended by: Sharin Grave on: 09/12/2011 11:01 AM   Modules accepted: Orders

## 2011-09-12 NOTE — Patient Instructions (Signed)
The current medical regimen is effective;  continue present plan and medications.  Follow up in 1 year with Dr Hochrein.  You will receive a letter in the mail 2 months before you are due.  Please call us when you receive this letter to schedule your follow up appointment.  

## 2011-09-26 LAB — BASIC METABOLIC PANEL
BUN: 11
BUN: 11
Chloride: 105
Creatinine, Ser: 0.77
GFR calc non Af Amer: >60
GFR calc non Af Amer: >60
Glucose, Bld: 92
Potassium: 4.3
Sodium: 142

## 2011-09-26 LAB — CARDIAC PANEL(CRET KIN+CKTOT+MB+TROPI)
CK, MB: 1.9
CK, MB: 2.2
CK, MB: 2.6
CK, MB: 2.7
Relative Index: INVALID
Relative Index: INVALID
Total CK: 44
Total CK: 65
Total CK: 68
Troponin I: 0.08 — ABNORMAL HIGH
Troponin I: 0.09 — ABNORMAL HIGH
Troponin I: 0.13 — ABNORMAL HIGH

## 2011-09-26 LAB — URINALYSIS, ROUTINE W REFLEX MICROSCOPIC
Bilirubin Urine: NEGATIVE
Glucose, UA: NEGATIVE
Ketones, ur: NEGATIVE
Specific Gravity, Urine: 1.019
pH: 6

## 2011-09-26 LAB — URINE MICROSCOPIC-ADD ON

## 2011-09-26 LAB — CBC
HCT: 29.5 — ABNORMAL LOW
Hemoglobin: 10.3 — ABNORMAL LOW
MCHC: 35
MCV: 87.2
MCV: 88.2
Platelets: 245
Platelets: 252
RDW: 14.3
RDW: 14.6
WBC: 6.3

## 2012-03-14 ENCOUNTER — Encounter: Payer: Self-pay | Admitting: Oncology

## 2012-03-14 ENCOUNTER — Telehealth: Payer: Self-pay | Admitting: Oncology

## 2012-03-14 ENCOUNTER — Ambulatory Visit (HOSPITAL_BASED_OUTPATIENT_CLINIC_OR_DEPARTMENT_OTHER): Payer: Medicare Other | Admitting: Oncology

## 2012-03-14 ENCOUNTER — Other Ambulatory Visit (HOSPITAL_BASED_OUTPATIENT_CLINIC_OR_DEPARTMENT_OTHER): Payer: Medicare Other | Admitting: Lab

## 2012-03-14 VITALS — BP 140/60 | HR 79 | Temp 97.3°F | Ht <= 58 in | Wt 128.0 lb

## 2012-03-14 DIAGNOSIS — D0590 Unspecified type of carcinoma in situ of unspecified breast: Secondary | ICD-10-CM

## 2012-03-14 DIAGNOSIS — D472 Monoclonal gammopathy: Secondary | ICD-10-CM | POA: Insufficient documentation

## 2012-03-14 DIAGNOSIS — D649 Anemia, unspecified: Secondary | ICD-10-CM | POA: Insufficient documentation

## 2012-03-14 DIAGNOSIS — M329 Systemic lupus erythematosus, unspecified: Secondary | ICD-10-CM

## 2012-03-14 DIAGNOSIS — R42 Dizziness and giddiness: Secondary | ICD-10-CM

## 2012-03-14 LAB — CBC WITH DIFFERENTIAL/PLATELET
Basophils Absolute: 0 10*3/uL (ref 0.0–0.1)
EOS%: 1.8 % (ref 0.0–7.0)
Eosinophils Absolute: 0.1 10*3/uL (ref 0.0–0.5)
HGB: 10.2 g/dL — ABNORMAL LOW (ref 11.6–15.9)
LYMPH%: 22.3 % (ref 14.0–49.7)
MCH: 32.1 pg (ref 25.1–34.0)
MCV: 92.9 fL (ref 79.5–101.0)
MONO%: 6.1 % (ref 0.0–14.0)
NEUT#: 4.1 10*3/uL (ref 1.5–6.5)
NEUT%: 69.2 % (ref 38.4–76.8)
Platelets: 274 10*3/uL (ref 145–400)

## 2012-03-14 NOTE — Progress Notes (Signed)
This office note has been dictated.  #[id number]

## 2012-03-14 NOTE — Telephone Encounter (Signed)
appts made and printed for June and sept   aom

## 2012-03-14 NOTE — Progress Notes (Signed)
CC:   [name], M.D. Bevelyn Buckles. Bensimhon, MD  PROBLEM LIST: 1. Anemia, etiology unclear but the patient states that she has been     anemic her whole life. 2. IgG kappa monoclonal gammopathy of uncertain significance, probably     benign, detected on 05/15/2011. 3. Orthostatic lightheadedness. 4. History of DCIS involving the left breast, status post sentinel     lymph node dissection which was negative and subsequently a     prophylactic right mastectomy along with left-sided mastectomy on     10/09/2006.  The patient has therefore had bilateral mastectomies. 5. History of coronary artery disease status post 4 stents, most     recent cardiac catheterization was on 04/11/2011. 6. Hypertension. 7. Dyslipidemia. 8. Hypothyroidism. 9. History of lupus in the past. 10.Psoriasis. 11.Recurrent urinary tract infections. 12.Soft systolic ejection murmur.  MEDICATIONS: 1. Norvasc 10 mg daily. 2. Aspirin 81 mg daily. 3. Plavix 75 mg daily. 4. Lidex cream 0.05% apply twice daily. 5. Flonase nasal spray used daily. 6. Levothyroxine 75 mcg daily. 7. Lisinopril/hydrochlorothiazide 20/25 mg 1 daily. 8. Nitrostat 0.4 mg sublingually as needed. 9. Crestor 20 mg daily. 10.VESIcare 5 mg daily.  HISTORY:  [name] who will soon be 78 years old in less than 1 week is seen today for followup of her anemia which has been asymptomatic.  Etiology is not clear.  Preliminary workup with blood test was unrevealing.  The patient was last seen by [place] on 08/17/2011. Her initial visit here was on 05/15/2011.  She is here today with her daughter-in-law, Gaylan Gerold.  The patient denies any changes in her condition.  She lives by herself, is quite energetic.  She has no complaints about her energy.  She does have some orthostatic lightheadedness.  We talked about that rather extensively today.  The patient denies any evidence of blood in her stools.  No melena.  She feels generally  well.  PHYSICAL EXAM:  The patient is a very spry, elderly woman looking younger than her stated age by about 10 years.  She is soon to be 78. Weight is 128 pounds.  The patient's appetite has been good although she has lost about 8 pounds; there have been some dietary modifications to account for this.  Height 4 feet 9 inches, body surface area 1.53 meter square.  Blood pressure 140/60.  Other vital signs are normal.  There is no scleral icterus.  Mouth and pharynx are benign.  No peripheral adenopathy palpable.  Lungs are clear to percussion and auscultation. Cardiac exam regular rhythm with soft systolic ejection murmur. Breasts:  The patient has had bilateral mastectomies.  No evidence for chest wall recurrence.  Abdomen is benign with no organomegaly or masses palpable.  Extremities:  No peripheral edema or clubbing.  Neurologic exam is grossly normal.  LABORATORY DATA:  Today, white count 5.9, ANC 4.1, hemoglobin 10.2, hematocrit 29.4, platelets 274,000.  On 08/17 hemoglobin was 11.1, hematocrit 32.5 and on 05/15/2011 hemoglobin was 10.9, hematocrit 31.2. Copy of the lab was given to the patient and her daughter-in-law.  Red cell indices are normal.  In the past the peripheral smear was unremarkable.  Workup in the past consisted of negative stools, normal iron, red cell, folate and B12 levels.  Serum immunofixation electrophoresis was positive for IgG kappa monoclonal gammopathy. However, quantitative immunoglobulins were normal.  Retic count in the past was normal.  Renal function in the past was normal.  Chemistries today as well as quantitative immunoglobulins  are pending.  Chemistries from 08/17/2011 were normal except for a BUN of 24.  Creatinine was 0.79, albumin 4.6.  On 08/17/2011 the IgG level was 1040, IgA 161, IgM 122.  Sedimentation rate was 11.  IMAGING STUDIES:  Chest x-ray, 2 view, from 03/22/2010 showed minimal chronic bronchitic changes, otherwise  normal.  IMPRESSION AND PLAN:  As stated, a copy of today's labs was given to the patient and her daughter-in-law.  Hemoglobin and hematocrit may be a little lower than they have been in August and May.  Significance of this is unclear.  The patient notes no change in her clinical status or energy.  We will continue to follow this conservatively.  Certainly if the patient were to have progressive decrease in her hemoglobin and hematocrit, the development of other changes or development of symptoms then I believe the next step would be to proceed with a bone marrow aspirate and biopsy.  The patient states that she has been anemic all of her life going back at least to her 30s although she cannot provide me with any details.  It will be recalled that she was born in [place] and that did not come to [place] until the early 1950s.  The possibility of a refractory anemia as part of myelodysplastic syndrome cannot be excluded.  The patient may have a congenital anemia, possibly hemoglobinopathy underlying her anemia with perhaps some superimposed other process.  In any event, the patient is getting along just fine.  I doubt that we are dealing with any major bone marrow pathology.  We will continue to observe the patient.  Will check a CBC in 3 months and plan to see Ms. Deramirez in 6 months at which time we will check CBC, chemistries and quantitative immunoglobulins.  If there are any significant changes then the patient will need a bone marrow aspirate and biopsy.  Certainly if the anemia would progress she may be a candidate for erythropoietin stimulating agents.    ______________________________ Samul Dada, M.D. DSM/MEDQ  D:  03/14/2012  T:  03/14/2012  Job:  [id number]

## 2012-03-15 LAB — IGG, IGA, IGM
IgA: 136 mg/dL (ref 69–380)
IgG (Immunoglobin G), Serum: 1000 mg/dL (ref 690–1700)
IgM, Serum: 113 mg/dL (ref 52–322)

## 2012-03-15 LAB — COMPREHENSIVE METABOLIC PANEL
Alkaline Phosphatase: 44 U/L (ref 39–117)
BUN: 22 mg/dL (ref 6–23)
CO2: 26 mEq/L (ref 19–32)
Creatinine, Ser: 0.98 mg/dL (ref 0.50–1.10)
Glucose, Bld: 112 mg/dL — ABNORMAL HIGH (ref 70–99)
Sodium: 140 mEq/L (ref 135–145)
Total Bilirubin: 0.6 mg/dL (ref 0.3–1.2)
Total Protein: 6.5 g/dL (ref 6.0–8.3)

## 2012-05-01 ENCOUNTER — Other Ambulatory Visit: Payer: Self-pay | Admitting: Internal Medicine

## 2012-05-01 NOTE — Telephone Encounter (Signed)
..   Requested Prescriptions   Pending Prescriptions Disp Refills  . CRESTOR 20 MG tablet [Pharmacy Med Name: CRESTOR 20MG  TAB] 30 tablet 5    Sig: TAKE ONE TABLET (20 MG TOTAL) BY MOUTH ONCE DAILY.

## 2012-06-11 ENCOUNTER — Ambulatory Visit (HOSPITAL_BASED_OUTPATIENT_CLINIC_OR_DEPARTMENT_OTHER): Payer: Medicare Other | Admitting: Lab

## 2012-06-11 ENCOUNTER — Other Ambulatory Visit: Payer: Self-pay | Admitting: Oncology

## 2012-06-11 ENCOUNTER — Encounter: Payer: Self-pay | Admitting: Oncology

## 2012-06-11 DIAGNOSIS — D649 Anemia, unspecified: Secondary | ICD-10-CM

## 2012-06-11 LAB — CBC WITH DIFFERENTIAL/PLATELET
EOS%: 2.1 % (ref 0.0–7.0)
Eosinophils Absolute: 0.1 10*3/uL (ref 0.0–0.5)
MCV: 94.4 fL (ref 79.5–101.0)
MONO%: 7.4 % (ref 0.0–14.0)
NEUT#: 3.3 10*3/uL (ref 1.5–6.5)
RBC: 2.84 10*6/uL — ABNORMAL LOW (ref 3.70–5.45)
RDW: 14.2 % (ref 11.2–14.5)
lymph#: 1.7 10*3/uL (ref 0.9–3.3)

## 2012-06-11 NOTE — Progress Notes (Signed)
CBC today was as follows: Hemoglobin 9.1. Hematocrit 26.8. White count and platelets were stable.  On 08/17/2011 hemoglobin was 11.1. On 03/14/2012 hemoglobin was 10.2. Thus there has been a 2 point drop in the hemoglobin since August 2012.  We will check CBC monthly. We may consider doing a bone marrow.

## 2012-06-13 ENCOUNTER — Other Ambulatory Visit: Payer: Medicare Other | Admitting: Lab

## 2012-06-18 ENCOUNTER — Telehealth: Payer: Self-pay | Admitting: Oncology

## 2012-06-18 NOTE — Telephone Encounter (Signed)
l/m with appts   aom

## 2012-06-19 ENCOUNTER — Other Ambulatory Visit: Payer: Self-pay | Admitting: Cardiovascular Disease

## 2012-06-19 DIAGNOSIS — I10 Essential (primary) hypertension: Secondary | ICD-10-CM

## 2012-06-19 MED ORDER — LISINOPRIL-HYDROCHLOROTHIAZIDE 20-25 MG PO TABS: 1.0000 | ORAL_TABLET | Freq: Every day | ORAL | Status: DC

## 2012-07-07 ENCOUNTER — Telehealth: Payer: Self-pay | Admitting: Oncology

## 2012-07-07 NOTE — Telephone Encounter (Signed)
sue called and pt is out out town and req not to r/s but will keep august appt  aom

## 2012-07-09 ENCOUNTER — Other Ambulatory Visit: Payer: Medicare Other | Admitting: Lab

## 2012-08-06 ENCOUNTER — Other Ambulatory Visit: Payer: Medicare Other | Admitting: Lab

## 2012-08-14 ENCOUNTER — Telehealth: Payer: Self-pay | Admitting: Oncology

## 2012-08-14 NOTE — Telephone Encounter (Signed)
ret her call to  r/s missed lab,done,aware   aom

## 2012-08-15 ENCOUNTER — Other Ambulatory Visit: Payer: Self-pay | Admitting: Oncology

## 2012-08-15 ENCOUNTER — Other Ambulatory Visit: Payer: Medicare Other | Admitting: Lab

## 2012-08-15 DIAGNOSIS — C50919 Malignant neoplasm of unspecified site of unspecified female breast: Secondary | ICD-10-CM

## 2012-08-15 DIAGNOSIS — D649 Anemia, unspecified: Secondary | ICD-10-CM

## 2012-08-15 LAB — CBC WITH DIFFERENTIAL/PLATELET
Eosinophils Absolute: 0.2 10*3/uL (ref 0.0–0.5)
HGB: 10.4 g/dL — ABNORMAL LOW (ref 11.6–15.9)
MONO#: 0.6 10*3/uL (ref 0.1–0.9)
NEUT#: 3.4 10*3/uL (ref 1.5–6.5)
Platelets: 275 10*3/uL (ref 145–400)
RBC: 3.19 10*6/uL — ABNORMAL LOW (ref 3.70–5.45)
RDW: 13.6 % (ref 11.2–14.5)
WBC: 5.9 10*3/uL (ref 3.9–10.3)

## 2012-08-19 ENCOUNTER — Other Ambulatory Visit: Payer: Self-pay | Admitting: *Deleted

## 2012-08-19 DIAGNOSIS — I10 Essential (primary) hypertension: Secondary | ICD-10-CM

## 2012-08-19 DIAGNOSIS — I2510 Atherosclerotic heart disease of native coronary artery without angina pectoris: Secondary | ICD-10-CM

## 2012-08-19 MED ORDER — LISINOPRIL-HYDROCHLOROTHIAZIDE 20-25 MG PO TABS: 1.0000 | ORAL_TABLET | Freq: Every day | ORAL | Status: DC

## 2012-08-19 MED ORDER — AMLODIPINE BESYLATE 10 MG PO TABS: 10.0000 mg | ORAL_TABLET | Freq: Every day | ORAL | Status: DC

## 2012-08-19 MED ORDER — CLOPIDOGREL BISULFATE 75 MG PO TABS: 75.0000 mg | ORAL_TABLET | Freq: Every day | ORAL | Status: DC

## 2012-08-19 NOTE — Telephone Encounter (Signed)
Fax Received. Refill Completed. Avayah Raffety Chowoe (R.M.A)   

## 2012-08-28 ENCOUNTER — Encounter: Payer: Self-pay | Admitting: Cardiology

## 2012-08-28 ENCOUNTER — Ambulatory Visit (INDEPENDENT_AMBULATORY_CARE_PROVIDER_SITE_OTHER): Payer: Medicare Other | Admitting: Cardiology

## 2012-08-28 VITALS — BP 100/50 | HR 60 | Ht <= 58 in | Wt 128.4 lb

## 2012-08-28 DIAGNOSIS — E785 Hyperlipidemia, unspecified: Secondary | ICD-10-CM

## 2012-08-28 DIAGNOSIS — I2510 Atherosclerotic heart disease of native coronary artery without angina pectoris: Secondary | ICD-10-CM

## 2012-08-28 DIAGNOSIS — R002 Palpitations: Secondary | ICD-10-CM

## 2012-08-28 DIAGNOSIS — I10 Essential (primary) hypertension: Secondary | ICD-10-CM

## 2012-08-28 NOTE — Progress Notes (Signed)
HPI The patient presents for evaluation of CAD.  She has a history of aortic dissection with stents. This was in 2009. More recently she's had catheterizations in 2011 and 2012 for symptoms of chest discomfort.  She presents again referred from her primary provider for evaluation of chest pain.  She says that this has been going on for a year but getting worse with increasing frequency in severity. She describes symptoms that sounds similar to previous presentations including the presentation last April when she had her catheterization. She gets chest pain with walking or at rest. It's somewhat sporadic but can be reproduced with activities. She bends down she might get this discomfort. She gets very dizzy when she bends down. Her head feels very full. She's been very fatigued. She's had some pain to her back. She rests and her symptoms will recover.  She's not had nausea or vomiting. She's not had syncope though she feels presyncopal at times.  Allergies  Allergen Reactions  . Codeine   . Diazepam     REACTION: nightmares  . Latex   . Nitrofurantoin Monohyd Macro     Hives, rash  . Penicillins   . Sulfonamide Derivatives     Current Outpatient Prescriptions  Medication Sig Dispense Refill  . amLODipine (NORVASC) 10 MG tablet Take 1 tablet (10 mg total) by mouth daily.  90 tablet  3  . aspirin 81 MG tablet Take 81 mg by mouth daily.        . clopidogrel (PLAVIX) 75 MG tablet Take 1 tablet (75 mg total) by mouth daily.  90 tablet  3  . CRESTOR 20 MG tablet TAKE ONE TABLET (20 MG TOTAL) BY MOUTH ONCE DAILY.  30 tablet  5  . fluocinonide (LIDEX) 0.05 % cream Apply topically 2 (two) times daily.        . fluticasone (FLONASE) 50 MCG/ACT nasal spray 2 sprays by Nasal route daily.        Marland Kitchen levothyroxine (SYNTHROID, LEVOTHROID) 75 MCG tablet Take 75 mcg by mouth daily.      Marland Kitchen lisinopril-hydrochlorothiazide (PRINZIDE,ZESTORETIC) 20-25 MG per tablet Take 1 tablet by mouth daily.  90 tablet  3  .  nitroGLYCERIN (NITROSTAT) 0.4 MG SL tablet Place 1 tablet (0.4 mg total) under the tongue every 5 (five) minutes as needed.  25 tablet  6  . polyvinyl alcohol (LIQUIFILM TEARS) 1.4 % ophthalmic solution 1 drop as needed.        . pregabalin (LYRICA) 75 MG capsule Take 75 mg by mouth daily.      . solifenacin (VESICARE) 5 MG tablet Take 5 mg by mouth daily.       Marland Kitchen Specialty Vitamins Products (MAGNESIUM, AMINO ACID CHELATE,) 133 MG tablet Take 1 tablet by mouth daily.      Marland Kitchen DISCONTD: omeprazole (PRILOSEC) 20 MG capsule Take 20 mg by mouth daily.          Past Medical History  Diagnosis Date  . Coronary artery disease     s/p PCI of LAD in 5-09 c/b dissection- > promus to LAD x 4  . Hypertension   . Hyperlipidemia   . Syncope and collapse   . Dizziness     chronic  . Breast cancer   . Hypothyroidism   . Lupus     hx of  . Diverticular disease     Past Surgical History  Procedure Date  . Cardiac catheterization 4-11    patent LAD stents. 70% OM lesion,  normal EF and RH pressures  . Mastectomy     bilateral  . Colon resection     partial  . Abdominal hysterectomy   . Appendectomy     ROS:  Back, left hip pain.  Otherwise as stated in the HPI and negative for all other systems.  PHYSICAL EXAM BP 100/50  Pulse 60  Ht 4\' 9"  (1.448 m)  Wt 128 lb 6.4 oz (58.242 kg)  BMI 27.79 kg/m2 GENERAL:  Well appearing HEENT:  Pupils equal round and reactive, fundi not visualized, oral mucosa unremarkable NECK:  No jugular venous distention, waveform within normal limits, carotid upstroke brisk and symmetric, no bruits, no thyromegaly LYMPHATICS:  No cervical, inguinal adenopathy LUNGS:  Clear to auscultation bilaterally BACK:  No CVA tenderness, lordosis CHEST:  Bilateral mastectomy HEART:  PMI not displaced or sustained,S1 and S2 within normal limits, no S3, no S4, no clicks, no rubs, soft apical systolic murmur ABD:  Flat, positive bowel sounds normal in frequency in pitch, no  bruits, no rebound, no guarding, no midline pulsatile mass, no hepatomegaly, no splenomegaly EXT:  2 plus pulses throughout, no edema, no cyanosis no clubbing SKIN:  No rashes no nodules NEURO:  Cranial nerves II through XII grossly intact, motor grossly intact throughout PSYCH:  Cognitively intact, oriented to person place and time  EKG:  Sinus rhythm, rate 60, left axis deviation, left anterior fascicular block, poor anterior R-wave progression, old anteroseptal infarct, no acute ST T wave changes.  08/28/2012  ASSESSMENT AND PLAN  CAD, NATIVE VESSEL -  Is very difficult to assess the patient's symptoms. However, it sounds like these are the same as previous though possibly more severe. The last 2 presentations that demonstrated only small vessel disease but with patent stents.  I do not subject her to another invasive study and would like to limit studies with radiation .she does need to be screened however. I will set her up for an exercise echocardiogram. To evaluate her dizziness she will have a Holter monitor. I'll see her back after that to further discuss.   HYPERTENSION, UNSPECIFIED -  The blood pressure is at target. No change in medications is indicated. As her symptoms could be related to medications I will review this with her on return.

## 2012-08-28 NOTE — Patient Instructions (Addendum)
Continue current medications as listed.  Your physician has requested that you have a stress echocardiogram. For further information please visit [email or web address] Please follow instruction sheet as given.  Your physician has recommended that you wear a holter monitor for 24 hours. Holter monitors are medical devices that record the heart's electrical activity. Doctors most often use these monitors to diagnose arrhythmias. Arrhythmias are problems with the speed or rhythm of the heartbeat. The monitor is a small, portable device. You can wear one while you do your normal daily activities. This is usually used to diagnose what is causing palpitations/syncope (passing out).

## 2012-09-05 NOTE — Addendum Note (Signed)
Addended by: Yolonda Kida on: 09/05/2012 01:36 PM   Modules accepted: Orders

## 2012-09-12 ENCOUNTER — Encounter (INDEPENDENT_AMBULATORY_CARE_PROVIDER_SITE_OTHER): Payer: Medicare Other

## 2012-09-12 ENCOUNTER — Encounter: Payer: Self-pay | Admitting: Cardiology

## 2012-09-12 ENCOUNTER — Other Ambulatory Visit (HOSPITAL_COMMUNITY): Payer: Medicare Other

## 2012-09-12 ENCOUNTER — Ambulatory Visit (HOSPITAL_COMMUNITY): Payer: Medicare Other | Attending: Cardiology | Admitting: Radiology

## 2012-09-12 ENCOUNTER — Ambulatory Visit (HOSPITAL_COMMUNITY): Payer: Medicare Other | Attending: Cardiology

## 2012-09-12 DIAGNOSIS — R002 Palpitations: Secondary | ICD-10-CM

## 2012-09-12 DIAGNOSIS — I2510 Atherosclerotic heart disease of native coronary artery without angina pectoris: Secondary | ICD-10-CM | POA: Insufficient documentation

## 2012-09-12 DIAGNOSIS — R001 Bradycardia, unspecified: Secondary | ICD-10-CM

## 2012-09-12 DIAGNOSIS — R0989 Other specified symptoms and signs involving the circulatory and respiratory systems: Secondary | ICD-10-CM

## 2012-09-12 DIAGNOSIS — I10 Essential (primary) hypertension: Secondary | ICD-10-CM | POA: Insufficient documentation

## 2012-09-12 NOTE — Progress Notes (Signed)
Echocardiogram performed.  

## 2012-09-12 NOTE — Progress Notes (Unsigned)
Exercise Treadmill Test  Pre-Exercise Testing Evaluation Rhythm: sinus bradycardia  Rate: 50   PR:  .22 QRS:  .08  QT:  .41 QTc: .37     Test  Exercise Tolerance Test Ordering MD: Angelina Sheriff, MD  Interpreting MD: Norma Fredrickson, NP  Unique Test No: 1  Treadmill:  1  Indication for ETT: Bradycardia  Contraindication to ETT: No   Stress Modality: exercise - treadmill  Cardiac Imaging Performed: non   Protocol: standard Bruce - maximal  Max BP:  ***/***  Max MPHR (bpm):  146 85% MPR (bpm):  124  MPHR obtained (bpm):  *** % MPHR obtained:  ***  Reached 85% MPHR (min:sec):  *** Total Exercise Time (min-sec):  ***  Workload in METS:  *** Borg Scale: ***  Reason ETT Terminated:  {CHL REASON TERMINATED FOR ZOX:09604540}    ST Segment Analysis At Rest: {CHL ST SEGMENT AT REST FOR JWJ:19147829} With Exercise: {CHL ST SEGMENT WITH EXERCISE FOR FAO:13086578}  Other Information Arrhythmia:  {CHL ARRHYTHMIA FOR ION:62952841} Angina during ETT:  {CHL ANGINA DURING LKG:40102725} Quality of ETT:  {CHL QUALITY OF DGU:44034742}  ETT Interpretation:  {CHL INTERPRETATION FOR VZD:63875643}  Comments: ***  Recommendations: ***

## 2012-09-16 ENCOUNTER — Telehealth: Payer: Self-pay | Admitting: Oncology

## 2012-09-16 ENCOUNTER — Encounter: Payer: Self-pay | Admitting: Oncology

## 2012-09-16 ENCOUNTER — Other Ambulatory Visit (HOSPITAL_BASED_OUTPATIENT_CLINIC_OR_DEPARTMENT_OTHER): Payer: Medicare Other | Admitting: Lab

## 2012-09-16 ENCOUNTER — Ambulatory Visit (HOSPITAL_BASED_OUTPATIENT_CLINIC_OR_DEPARTMENT_OTHER): Payer: Medicare Other | Admitting: Oncology

## 2012-09-16 VITALS — BP 159/74 | HR 76 | Temp 98.7°F | Resp 20 | Ht <= 58 in | Wt 129.7 lb

## 2012-09-16 DIAGNOSIS — R0602 Shortness of breath: Secondary | ICD-10-CM

## 2012-09-16 DIAGNOSIS — R42 Dizziness and giddiness: Secondary | ICD-10-CM

## 2012-09-16 DIAGNOSIS — D649 Anemia, unspecified: Secondary | ICD-10-CM

## 2012-09-16 DIAGNOSIS — D472 Monoclonal gammopathy: Secondary | ICD-10-CM

## 2012-09-16 LAB — CBC WITH DIFFERENTIAL/PLATELET
Basophils Absolute: 0 10*3/uL (ref 0.0–0.1)
EOS%: 3 % (ref 0.0–7.0)
HGB: 9.5 g/dL — ABNORMAL LOW (ref 11.6–15.9)
MCH: 31.1 pg (ref 25.1–34.0)
MCV: 94.4 fL (ref 79.5–101.0)
MONO%: 9.2 % (ref 0.0–14.0)
RDW: 13.8 % (ref 11.2–14.5)

## 2012-09-16 LAB — COMPREHENSIVE METABOLIC PANEL (CC13)
ALT: 9 U/L (ref 0–55)
AST: 14 U/L (ref 5–34)
CO2: 28 mEq/L (ref 22–29)
Creatinine: 0.8 mg/dL (ref 0.6–1.1)
Sodium: 142 mEq/L (ref 136–145)
Total Bilirubin: 0.9 mg/dL (ref 0.20–1.20)
Total Protein: 6.9 g/dL (ref 6.4–8.3)

## 2012-09-16 NOTE — Progress Notes (Signed)
This office note has been dictated.  #[id number]

## 2012-09-16 NOTE — Telephone Encounter (Signed)
gv and printed appt to pt.

## 2012-09-16 NOTE — Progress Notes (Signed)
CC:   [name], M.D.   PROBLEM LIST:  1. Anemia, etiology unclear but the patient states that she has been  anemic her whole life.  2. IgG kappa monoclonal gammopathy of uncertain significance, probably  benign, detected on 05/15/2011.  3. Orthostatic lightheadedness.  4. History of DCIS involving the left breast, status post sentinel  lymph node dissection which was negative and subsequently a  prophylactic right mastectomy along with left-sided mastectomy on  10/09/2006. The patient has therefore had bilateral mastectomies.  5. History of coronary artery disease status post 4 stents, most  recent cardiac catheterization was on 04/11/2011.  6. Hypertension.  7. Dyslipidemia.  8. Hypothyroidism.  9. History of lupus in the past.  10.Psoriasis.  11.Recurrent urinary tract infections.  12.Soft systolic ejection murmur. 13.Numbness and pain of feet and legs.    MEDICATIONS:  1. Norvasc 10 mg daily.  2. Aspirin 81 mg daily.  3. Plavix 75 mg daily.  4. Lidex cream 0.05% apply twice daily.  5. Flonase nasal spray used daily.  6. Levothyroxine 75 mcg daily.  7. Lisinopril/hydrochlorothiazide 20/25 mg 1 daily.  8. Nitrostat 0.4 mg sublingually as needed.  9. Crestor 20 mg daily.  10.VESIcare 5 mg daily. 11.Lyrica 75 mg daily.  SMOKING HISTORY:  The patient has never smoked cigarettes.   HISTORY:  We are seeing [name] today for followup of an anemia of uncertain etiology.  The patient has been anemic for a long time.  She says that she has been anemic all her life.  In our records, her anemia goes back to at least 2009 and even to 2007.  In 2009 hemoglobin was 10.2 and in 2007 hemoglobin was 11.4.  The patient has not had a bone marrow.  Her anemia seems to be asymptomatic.  The patient's main problems continue to be intermittent dizziness, chest tightness, and shortness of breath, which tend to occur with exertion.  The patient does have coronary artery disease  and has undergone cardiac catheterization on 04/11/2011.  She tells me that she had a stress test just this past Friday on September 13th and just completed cardiac monitoring.  She sees a cardiologist at [facility].  Despite these symptoms, she remains extremely active for her age of 59.  She is active around her house, working in the garden, doing housework, and seems to function despite intermittent symptomatology.  The patient denies any evidence of blood in her stools.  I am not aware that she has ever had blood transfusions and we have not treated her with any erythropoietin-stimulating agents.  In looking over her chart, I see where her hemoglobins have been somewhat variable, again for reasons that are unclear.  Hemoglobins have been as low as 9.1 and no higher than 11.4.  The patient has never had a colonoscopy.  Stools in the past have been Hemoccult negative, along with other usual workup for anemia. The patient does have an IgG kappa monoclonal gammopathy that was first detected in May 2012.  Quantitative immunoglobulins are normal and have been stable, suggesting a benign gammopathy.  PHYSICAL EXAMINATION:  General:  The patient certainly looks well.  She is here by herself after having been dropped off.  She says that her children do not let her drive here in [place], but she does drive when she goes to [place].  Vital Signs:  Weight is 129.7 pounds, height 4 feet 9 inches, body surface area 1.54 sq m.  Blood pressure 159/74. Other  vital signs are normal.  HEENT:  There is no scleral icterus. Mouth and pharynx are benign.  Lymph:  There is no peripheral adenopathy palpable.  Lungs:  Clear to percussion and auscultation.  Cardiac Exam: Regular rhythm with a soft systolic ejection murmur.  Breasts:  The patient has undergone bilateral mastectomies with no evidence for chest wall recurrence.  Abdomen:  Benign with no organomegaly or masses palpable.  Extremities:   No peripheral edema or clubbing.  Neurologic Exam:  Normal.  LABORATORY DATA:  Today white count 7.0, ANC 4.4, hemoglobin 9.5, hematocrit 28.8, platelets 274,000.  MCV and MCH are normal, as is the differential.  On 08/15/2012, hemoglobin was 10.4, hematocrit 30.4. However, on June 11, 2012, hemoglobin was 9.1, hematocrit 26.8.  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 Chemistries today are completely normal.  BUN 19.0, creatinine 0.8, albumin 3.9.  Liver function tests including LDH were normal.  In the past, the patient's sed rate has been normal and her retic count has not been elevated.  Quantitative immunoglobulins today are pending. Quantitative immunoglobulins from 03/14/2012 were normal.  IgG was 1000, IgA 136, and IgM 113.  IMAGING STUDIES:  1. Chest x-ray, 2 view, from 03/22/2010 showed minimal  chronic bronchitic changes, otherwise normal.   IMPRESSION AND PLAN:  The patient's main problems appear to be dizziness, chest tightness, and shortness of breath, currently being evaluated by her cardiologist.  Her anemia, I believe, is asymptomatic, although of undefined etiology.  There is more variability in her hemoglobin and hematocrit than I can readily explain.  Because of the patient's age and the fact that she is so active without apparent symptoms from her anemia, we have been rather conservative with regard to our evaluation.  In addition, there is evidence that this anemia goes back at least 6 years and by history even further.  We will continue to be conservative.  As previously stated, if the patient were to be come more anemic or other changes were to develop, then she may need a bone marrow aspirate and biopsy and we would consider treating her with erythropoietin-stimulating agents.  None  of that appears to be necessary at this time.  We will check a CBC in 3 months, which will be mid December and plan to see the patient again in 6 months, which will be mid March 2014.  We will check CBC, chemistries, and quantitative immunoglobulins in March.    ______________________________ Samul Dada, M.D. DSM/MEDQ  D:  09/16/2012  T:  09/16/2012  Job:  [id number]

## 2012-09-17 ENCOUNTER — Ambulatory Visit: Payer: Medicare Other | Admitting: Cardiology

## 2012-09-17 LAB — IGG, IGA, IGM: IgG (Immunoglobin G), Serum: 1090 mg/dL (ref 690–1700)

## 2012-09-18 ENCOUNTER — Telehealth: Payer: Self-pay | Admitting: Cardiology

## 2012-09-18 NOTE — Telephone Encounter (Signed)
Pt's dtr n law rtn call re test results, ok to leave message

## 2012-09-18 NOTE — Telephone Encounter (Signed)
Reviewed results of echo with daughter in law who will convey to pt.

## 2012-09-20 ENCOUNTER — Other Ambulatory Visit: Payer: Self-pay | Admitting: Internal Medicine

## 2012-09-22 NOTE — Telephone Encounter (Signed)
Fax Received. Refill Completed. [name] (R.M.A)   

## 2012-10-05 IMAGING — CR DG CHEST 1V PORT
1 series · 1 of 1 positions shown · non-contrast
Comparison: 03/22/2010

CLINICAL DATA: Chest pain

PORTABLE CHEST - 1 VIEW

[view not recorded]
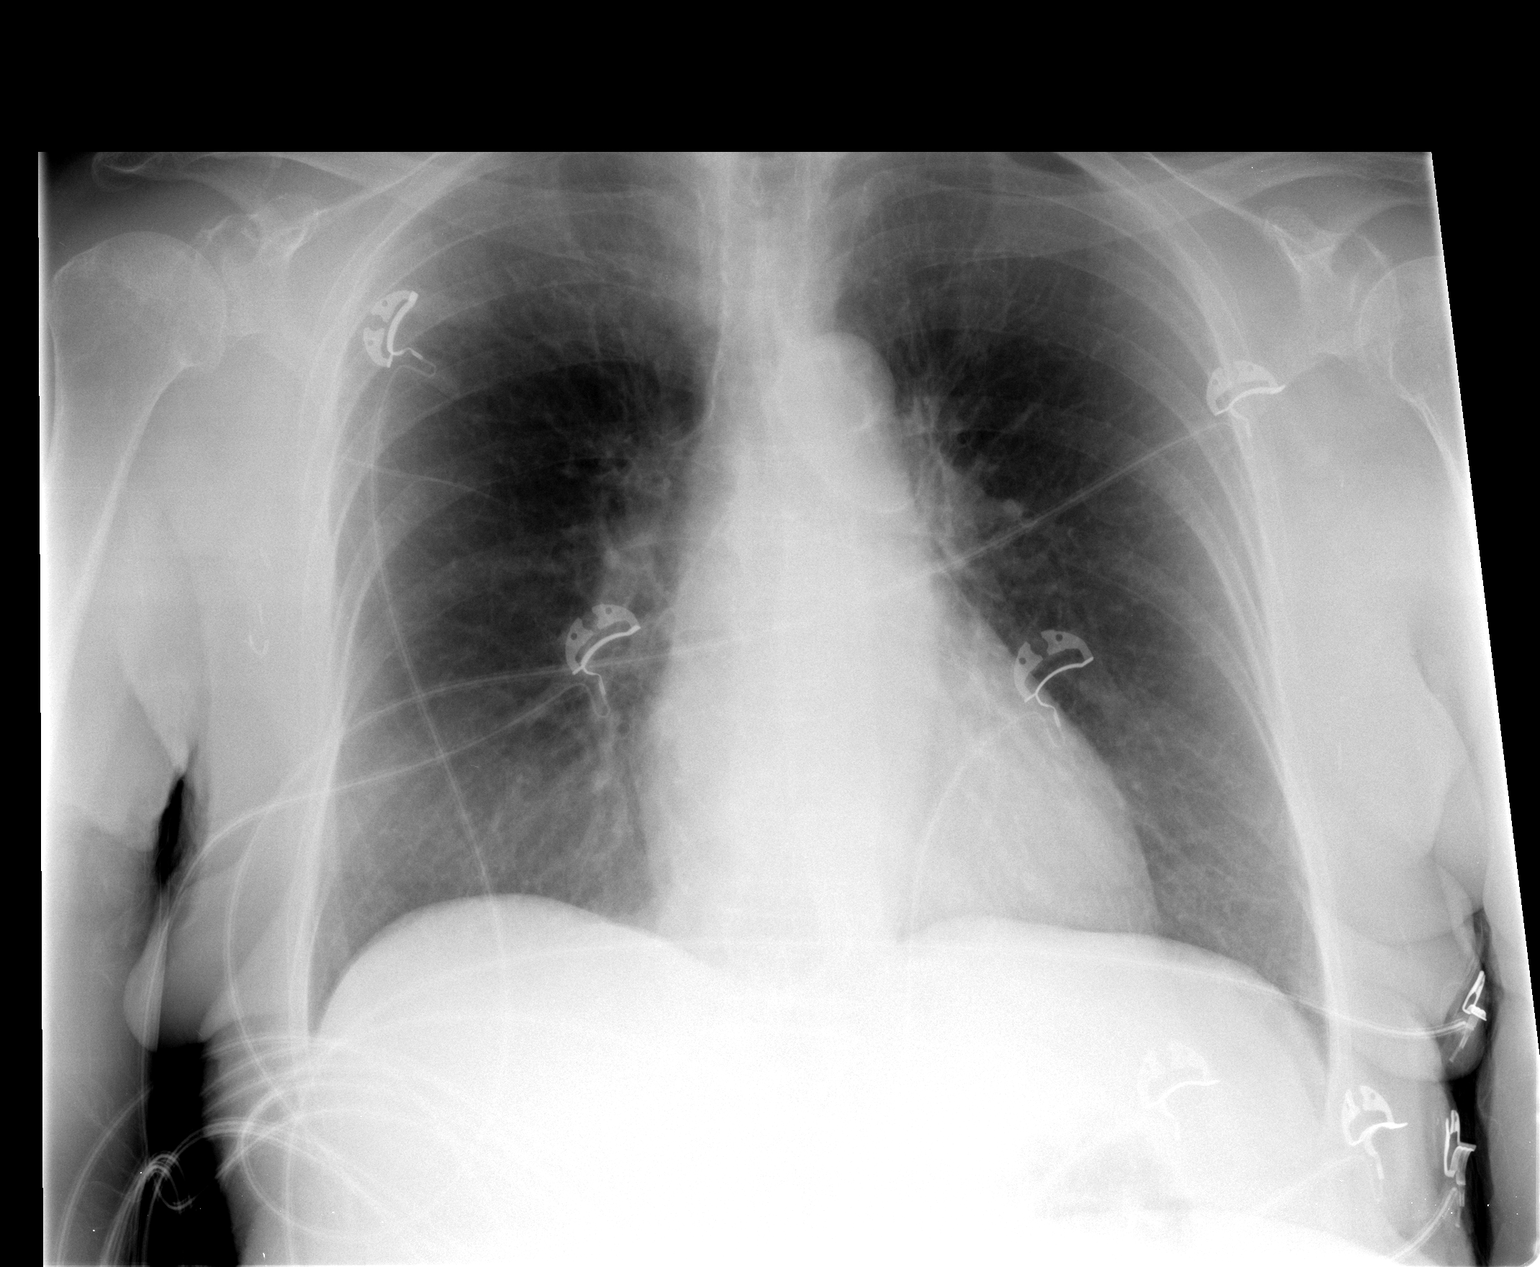

[1 of 1 positions shown; findings below may reference images not displayed]

FINDINGS: Heart and mediastinal contours normal.  Lungs clear.
Osseous structures intact in one-view.
IMPRESSION: No active disease in one-view.

## 2012-11-04 ENCOUNTER — Encounter: Payer: Self-pay | Admitting: Gastroenterology

## 2012-11-05 ENCOUNTER — Telehealth: Payer: Self-pay | Admitting: Medical Oncology

## 2012-11-05 NOTE — Telephone Encounter (Signed)
Pt's daughter Fannie Knee called stating that her mother has seen Dr.Berliner at [facility]. She is going to need robotic surgery on her bladder. She states that Dr. Mia Creek will not do surgery on her mother with a HGB of 9.9. She states that Dr. Arline Asp had talked about a medication that could help her increase her red count. She also stated that Dr. Arline Asp had wanted her mother to have a colonoscopy but her mother did not want to to do. Dr. Mia Creek wants pt to have the colonoscopy before she has the surgery. I explained this is due to her anemia. I will speak with Dr. Arline Asp and call her back. I spoke with Dr. Arline Asp and he needs to speak with Dr. Mia Creek about the pt. I  Called Dr. Mart Piggs office at [phone] and left a message asking Dr. Mia Creek to call Dr. Arline Asp on his cell to discuss the pt.

## 2012-11-10 ENCOUNTER — Encounter: Payer: Self-pay | Admitting: Medical Oncology

## 2012-11-10 ENCOUNTER — Encounter: Payer: Self-pay | Admitting: Oncology

## 2012-11-10 NOTE — Progress Notes (Unsigned)
I spoke with Dr. Blima Rich at [facility], cell: [phone].  Patient has a severe vaginal prolapse which should be repaired.    We faxed the most recent note from [name] (08/28/12)  to Dr. Mia Creek.  Patient  had a stress ECHO on 09/12/12.Marland Kitchen

## 2012-11-10 NOTE — Progress Notes (Signed)
Office notes from Dr. Kirtland Bouchard faxed to Dr. Blima Rich [phone]

## 2012-12-01 ENCOUNTER — Ambulatory Visit: Payer: Medicare Other | Admitting: Gastroenterology

## 2012-12-15 ENCOUNTER — Other Ambulatory Visit: Payer: Medicare Other | Admitting: Lab

## 2013-03-02 ENCOUNTER — Telehealth: Payer: Self-pay | Admitting: Medical Oncology

## 2013-03-02 NOTE — Telephone Encounter (Signed)
[name] called to inform us that her mother saw [name] this am. He HGB was 8.6. [name] just asked them to call Dr. Arline Asp with this result. I asked her if her mother is short of breath,weak or dizzy. She states that her mother always has dizziness but it has been a little worse.I asked if she thinks her mother needs a blood transfusion. She does not  Think at this point.  She states that her mother has been away for some time and has not had labs checked. She also has a questionable UTI/kidney infection, and H.Pylori. Per Dr. Arline Asp these infections could be affecting her anemia. She has an appointment with [place] 03/16/13. I asked her to call if her mother gets weaker or gets short of breath. We can always recheck her labs and give her blood if needed. Dr. Arline Asp also mentioned in his last note and also today that aranesp might be an option for the pt. [name] voiced understanding and will call is she needs [place].

## 2013-03-16 ENCOUNTER — Other Ambulatory Visit: Payer: Medicare Other | Admitting: Lab

## 2013-03-16 ENCOUNTER — Telehealth: Payer: Self-pay | Admitting: Oncology

## 2013-03-16 ENCOUNTER — Ambulatory Visit (HOSPITAL_BASED_OUTPATIENT_CLINIC_OR_DEPARTMENT_OTHER): Payer: Medicare Other | Admitting: Oncology

## 2013-03-16 ENCOUNTER — Ambulatory Visit: Payer: Medicare Other | Admitting: Oncology

## 2013-03-16 ENCOUNTER — Encounter: Payer: Self-pay | Admitting: Oncology

## 2013-03-16 ENCOUNTER — Other Ambulatory Visit (HOSPITAL_BASED_OUTPATIENT_CLINIC_OR_DEPARTMENT_OTHER): Payer: Medicare Other | Admitting: Lab

## 2013-03-16 VITALS — BP 120/54 | HR 74 | Temp 97.4°F | Resp 20 | Ht <= 58 in | Wt 123.2 lb

## 2013-03-16 DIAGNOSIS — R42 Dizziness and giddiness: Secondary | ICD-10-CM

## 2013-03-16 DIAGNOSIS — D649 Anemia, unspecified: Secondary | ICD-10-CM

## 2013-03-16 DIAGNOSIS — D472 Monoclonal gammopathy: Secondary | ICD-10-CM

## 2013-03-16 DIAGNOSIS — D539 Nutritional anemia, unspecified: Secondary | ICD-10-CM

## 2013-03-16 DIAGNOSIS — Z853 Personal history of malignant neoplasm of breast: Secondary | ICD-10-CM

## 2013-03-16 LAB — COMPREHENSIVE METABOLIC PANEL (CC13)
AST: 14 U/L (ref 5–34)
Alkaline Phosphatase: 43 U/L (ref 40–150)
BUN: 28.6 mg/dL — ABNORMAL HIGH (ref 7.0–26.0)
Creatinine: 0.9 mg/dL (ref 0.6–1.1)
Glucose: 83 mg/dl (ref 70–99)
Potassium: 4.1 mEq/L (ref 3.5–5.1)
Total Bilirubin: 0.86 mg/dL (ref 0.20–1.20)

## 2013-03-16 LAB — RETICULOCYTES
RBC: 2.88 10*6/uL — ABNORMAL LOW (ref 3.70–5.45)
Retic %: 1.44 % (ref 0.70–2.10)
Retic Ct Abs: 41.47 10*3/uL (ref 33.70–90.70)

## 2013-03-16 LAB — CBC WITH DIFFERENTIAL/PLATELET
BASO%: 1.1 % (ref 0.0–2.0)
Basophils Absolute: 0.1 10*3/uL (ref 0.0–0.1)
EOS%: 3.6 % (ref 0.0–7.0)
HCT: 25.2 % — ABNORMAL LOW (ref 34.8–46.6)
HGB: 8.8 g/dL — ABNORMAL LOW (ref 11.6–15.9)
MCH: 31.8 pg (ref 25.1–34.0)
MCHC: 35 g/dL (ref 31.5–36.0)
MCV: 90.7 fL (ref 79.5–101.0)
MONO%: 10.5 % (ref 0.0–14.0)
NEUT%: 54.6 % (ref 38.4–76.8)
RDW: 14.3 % (ref 11.2–14.5)
lymph#: 1.6 10*3/uL (ref 0.9–3.3)

## 2013-03-16 LAB — MORPHOLOGY: PLT EST: ADEQUATE

## 2013-03-16 LAB — IGG, IGA, IGM
IgA: 167 mg/dL (ref 69–380)
IgM, Serum: 141 mg/dL (ref 52–322)

## 2013-03-16 NOTE — Telephone Encounter (Signed)
Gave pt appt for lab and MD on April and May 2014 °

## 2013-03-16 NOTE — Progress Notes (Signed)
This office note has been dictated.  #[id number]

## 2013-03-16 NOTE — Progress Notes (Signed)
CC:   [name], M.D.  PROBLEM LIST:  1. Anemia, etiology unclear but [name] states that she has been  anemic her whole life.  2. IgG kappa monoclonal gammopathy of uncertain significance, probably  benign, detected on 05/15/2011.  3. Orthostatic lightheadedness.  4. History of DCIS involving Janet left breast, status post sentinel  lymph node dissection which was negative and subsequently a  prophylactic right mastectomy along with left-sided mastectomy on  10/09/2006. [name] has therefore had bilateral mastectomies.  5. History of coronary artery disease status post 4 stents, most  recent cardiac catheterization was on 04/11/2011.  6. Hypertension.  7. Dyslipidemia.  8. Hypothyroidism.  9. History of lupus in Janet past.  10.Psoriasis.  11.Recurrent urinary tract infections.  12.Soft systolic ejection murmur.  13.Numbness and pain of feet and legs.   MEDICATIONS:  Reviewed and recorded. Current Outpatient Prescriptions  Medication Sig Dispense Refill  . amLODipine (NORVASC) 10 MG tablet Take 1 tablet (10 mg total) by mouth daily.  90 tablet  3  . aspirin 81 MG tablet Take 81 mg by mouth daily.        . clopidogrel (PLAVIX) 75 MG tablet Take 1 tablet (75 mg total) by mouth daily.  90 tablet  3  . CRESTOR 20 MG tablet TAKE ONE TABLET (20 MG TOTAL) BY MOUTH ONCE DAILY.  30 tablet  5  . fluocinonide (LIDEX) 0.05 % cream Apply topically 2 (two) times daily.        . fluticasone (FLONASE) 50 MCG/ACT nasal spray 2 sprays by Nasal route daily.        . iron polysaccharides (NIFEREX) 150 MG capsule Take 150 mg by mouth daily.      Marland Kitchen levothyroxine (SYNTHROID, LEVOTHROID) 100 MCG tablet Take 100 mcg by mouth daily.      Marland Kitchen lisinopril-hydrochlorothiazide (PRINZIDE,ZESTORETIC) 20-25 MG per tablet Take 1 tablet by mouth daily.  90 tablet  3  . NITROSTAT 0.4 MG SL tablet PLACE 1 TABLET (0.4 MG TOTAL) UNDER Janet TONGUE EVERY 5 (FIVE) MINUTES AS NEEDED.  25 each  3  . pregabalin  (LYRICA) 75 MG capsule Take 75 mg by mouth daily.      . solifenacin (VESICARE) 5 MG tablet Take 5 mg by mouth daily.       Marland Kitchen Specialty Vitamins Products (MAGNESIUM, AMINO ACID CHELATE,) 133 MG tablet Take 1 tablet by mouth daily.      . polyvinyl alcohol (LIQUIFILM TEARS) 1.4 % ophthalmic solution 1 drop as needed.        . [DISCONTINUED] omeprazole (PRILOSEC) 20 MG capsule Take 20 mg by mouth daily.         No current facility-administered medications for this visit.    SMOKING HISTORY:  [name] has never smoked cigarettes.   HISTORY:  [name] was seen today for followup of several problems, specifically an anemia that dates back many years, an IgG kappa monoclonal gammopathy of uncertain significance detected in mid May 2012, and a history of DCIS involving Janet left breast for which [name] underwent sentinel lymph node dissection which was negative and bilateral mastectomies, Janet right side being prophylactic.  [name] was last seen by [place] on 09/16/2012.  She is accompanied today by her daughter-in-law, Fannie Knee.  [name] states that she has been away from [facility] for quite a period of time.  [place], I believe, one of her children.  She has also spent some time  in [place].  She apparently had been having some abdominal problems, saw Dr. Collins Scotland within Janet past month or so.  Apparently, she was told she had an infection, which I am presuming might have been H pylori, treated with antibiotics.  At that time, [name] was found to have a hemoglobin in Janet mid 8's.  [name] was given iron.  She denies any obvious blood in her stools.  Stools are dark on iron.  [name]'s main problem continues to be dizziness which she has had for many years.  This is worse in Janet mornings.  She says that there is spinning.  If she lies down, symptoms are improved.  She feels like she is going to pass out or die.  She has not been falling.   She denies any loss of consciousness.  It is not clear to me whether she has ever had a thorough workup for this.  I did suggest that she see a neurologist for evaluation.  In Janet past I think we thought her symptoms were related to orthostatics change.  [name] does complain of fatigue.  She is going to be 79 years old later this week.  She remains active and spry for her age.  PHYSICAL EXAM:  General:  Carlson's weight today is 123 pounds 3.2 ounces, height 4 feet 9 inches, body surface area 1.5 sq m.  It should be noted that [name]'s weight on 09/16/2012 was 130 pounds.  Thus, there has been about a 7-pound weight loss.  [name] states that she is eating well.  Vital Signs:  Blood pressure 120/54.  Other vital signs are normal.  HEENT:  There is no scleral icterus.  Mouth and pharynx are benign.  No peripheral adenopathy palpable.  Lungs:  Clear to percussion and auscultation.  Cardiac:  Regular rhythm with a possible soft systolic ejection murmur.  Breasts:  [name] has undergone bilateral mastectomies.  No evidence for chest wall recurrence.  Abdomen:  Benign with no organomegaly or masses palpable.  Extremities:  No peripheral edema or clubbing.  Neurologic:  Exam is grossly normal.  LABORATORY DATA:  Today, white count 5.4, ANC 2.9, hemoglobin 8.8, hematocrit 25.2, platelets 323,000.  Retic count today was 1.44% with an absolute of 41.5; therefore, Janet retic count is not elevated. Chemistries notable for an albumin of 3.4, whereas on 09/16/2012 albumin was 3.9.  BUN 29, creatinine 0.9, LDH 171.  Pending today are stools for occult blood, iron studies, vitamin B12, and RBC folate.  These values were checked back about 2 years ago in May 2012 and were essentially normal.  Janet ferritin was 104.  We are checking iron studies today as well.  Quantitative immunoglobulins are also pending.  On 09/16/2012 quantitative immunoglobulins were normal.  IgG level was 1090.   Also pending today are morphology and inspection of Janet peripheral smear  IMAGING STUDIES:  1. Chest x-ray, 2 view, from 03/22/2010 showed minimal  chronic bronchitic changes, otherwise normal.   IMPRESSION AND PLAN:  [name]'s hemoglobin and hematocrit are significantly decreased today, down to 8.8 and 25.2, for reasons that are unclear.  If we go back to 2012 and early part of 2013, it looks like Janet hemoglobin was in Janet 10-11 range.  Since then there has been some variability.  On 08/15/2012 hemoglobin was 10.4, whereas on 09/16/2012 hemoglobin was 9.5, and today hemoglobin is 8.8.  We are investigating Janet reasons for Janet drop in Janet hemoglobin.  It  is difficult to tell how asymptomatic [name] is from her hemoglobin. She continues to complain of dizziness, which seems to be her main problem and has been a problem for quite some time.  This may need neurologic evaluation.  She may need tilt table measurements to see if there is an orthostatic component to this.  In Janet past I believe we did think that there was some orthostatic component, although her blood pressures that have been recorded here have all been in Janet normal range.  [name] is on Norvasc.  I have asked [name] to hold iron while we check stools for occult blood.  I have explained to [name] and her daughter-in-law Fannie Knee that if Janet evaluation from Janet blood tests is not helpful, that we may need to do a bone marrow aspirate and biopsy to see if we can come up with an explanation for [name]'s anemia.  Certainly, we could consider treating her with erythropoietin stimulating agents.  We will check a CBC in 1 month and plan to see [name] in 2 months, at which time we will check CBC and chemistries.    ______________________________ Samul Dada, M.D. DSM/MEDQ  D:  03/16/2013  T:  03/16/2013  Job:  [id number]

## 2013-03-17 ENCOUNTER — Other Ambulatory Visit: Payer: Self-pay | Admitting: Oncology

## 2013-03-17 DIAGNOSIS — D472 Monoclonal gammopathy: Secondary | ICD-10-CM

## 2013-03-18 ENCOUNTER — Encounter: Payer: Self-pay | Admitting: Oncology

## 2013-03-18 NOTE — Progress Notes (Signed)
The peripheral blood smear from 03/16/2013 was reviewed. I did not see any significant abnormalities. Red cells were unremarkable.

## 2013-03-20 ENCOUNTER — Other Ambulatory Visit: Payer: Medicare Other | Admitting: Lab

## 2013-03-20 ENCOUNTER — Ambulatory Visit: Payer: Medicare Other | Admitting: Physician Assistant

## 2013-03-20 LAB — IRON AND TIBC
Iron: 127 ug/dL (ref 42–145)
TIBC: 265 ug/dL (ref 250–470)
UIBC: 138 ug/dL (ref 125–400)

## 2013-03-20 LAB — VITAMIN B12: Vitamin B-12: 426 pg/mL (ref 211–911)

## 2013-04-16 ENCOUNTER — Other Ambulatory Visit (HOSPITAL_BASED_OUTPATIENT_CLINIC_OR_DEPARTMENT_OTHER): Payer: Medicare Other | Admitting: Lab

## 2013-04-16 DIAGNOSIS — D649 Anemia, unspecified: Secondary | ICD-10-CM

## 2013-04-16 LAB — CBC WITH DIFFERENTIAL/PLATELET
BASO%: 1.3 % (ref 0.0–2.0)
EOS%: 2.9 % (ref 0.0–7.0)
HCT: 25.3 % — ABNORMAL LOW (ref 34.8–46.6)
LYMPH%: 35.7 % (ref 14.0–49.7)
MCH: 31.6 pg (ref 25.1–34.0)
MCHC: 34.7 g/dL (ref 31.5–36.0)
NEUT%: 49.3 % (ref 38.4–76.8)
Platelets: 333 10*3/uL (ref 145–400)
RBC: 2.78 10*6/uL — ABNORMAL LOW (ref 3.70–5.45)

## 2013-04-27 ENCOUNTER — Telehealth: Payer: Self-pay

## 2013-04-27 ENCOUNTER — Other Ambulatory Visit: Payer: Self-pay

## 2013-04-27 DIAGNOSIS — D472 Monoclonal gammopathy: Secondary | ICD-10-CM

## 2013-04-27 DIAGNOSIS — D649 Anemia, unspecified: Secondary | ICD-10-CM

## 2013-04-27 NOTE — Telephone Encounter (Signed)
lvm that we scheduled a bone marrow biopsy on 05/07/13 at 0700 at [facility]. Requested she call back for instructions. Also mailed instructions

## 2013-04-28 ENCOUNTER — Telehealth: Payer: Self-pay

## 2013-04-28 NOTE — Telephone Encounter (Signed)
lvm on son's voice mail about BMBX scheduled 05/07/13 at [place] at 7 am. Also pt is NPO after MN, and to bring a driver. Wanted to make sure pt got the message from yesterday.

## 2013-05-01 ENCOUNTER — Telehealth: Payer: Self-pay | Admitting: Medical Oncology

## 2013-05-01 ENCOUNTER — Encounter (HOSPITAL_COMMUNITY): Payer: Self-pay | Admitting: Pharmacy Technician

## 2013-05-01 NOTE — Telephone Encounter (Signed)
Pt called and asked if she needs to stop her plavix for the bone marrow biopsy. Per Dr. Arline Asp no. Pt voiced understanding.

## 2013-05-06 ENCOUNTER — Telehealth: Payer: Self-pay

## 2013-05-06 NOTE — Telephone Encounter (Signed)
Daughter-in-law called to confirm location and time for BMBX. Her mother is anxious about pain. We discussed conscious sedation. Reiterated instructions: NPO after MN, AM pills with sips of water, need a driver.

## 2013-05-07 ENCOUNTER — Encounter (HOSPITAL_COMMUNITY): Payer: Self-pay

## 2013-05-07 ENCOUNTER — Ambulatory Visit (HOSPITAL_COMMUNITY)
Admission: RE | Admit: 2013-05-07 | Discharge: 2013-05-07 | Disposition: A | Discharge status: Home / Self Care | Payer: Medicare Other | Source: Ambulatory Visit | Attending: Oncology | Admitting: Oncology

## 2013-05-07 VITALS — BP 113/46 | HR 75 | Temp 97.7°F | Resp 18 | Ht <= 58 in | Wt 123.0 lb

## 2013-05-07 DIAGNOSIS — D649 Anemia, unspecified: Secondary | ICD-10-CM

## 2013-05-07 DIAGNOSIS — D472 Monoclonal gammopathy: Secondary | ICD-10-CM

## 2013-05-07 LAB — CBC WITH DIFFERENTIAL/PLATELET
Basophils Absolute: 0 10*3/uL (ref 0.0–0.1)
Basophils Relative: 0 % (ref 0–1)
Eosinophils Absolute: 0.1 10*3/uL (ref 0.0–0.7)
Eosinophils Relative: 2 % (ref 0–5)
Lymphocytes Relative: 27 % (ref 12–46)
MCHC: 34.1 g/dL (ref 30.0–36.0)
MCV: 89.5 fL (ref 78.0–100.0)
Monocytes Absolute: 0.6 10*3/uL (ref 0.1–1.0)
Platelets: 357 10*3/uL (ref 150–400)
RBC: 2.95 MIL/uL — ABNORMAL LOW (ref 3.87–5.11)
RDW: 16 % — ABNORMAL HIGH (ref 11.5–15.5)
WBC: 7.4 10*3/uL (ref 4.0–10.5)

## 2013-05-07 LAB — BONE MARROW EXAM: Bone Marrow Exam: 290

## 2013-05-07 MED ORDER — MIDAZOLAM HCL 5 MG/5ML IJ SOLN
INTRAMUSCULAR | Status: AC | PRN
  Administered 2013-05-07: 2 mg via INTRAVENOUS

## 2013-05-07 MED ORDER — MEPERIDINE HCL 25 MG/ML IJ SOLN
INTRAMUSCULAR | Status: AC | PRN
  Administered 2013-05-07 (×2): 25 mg via INTRAVENOUS

## 2013-05-07 MED ORDER — MEPERIDINE HCL 50 MG/ML IJ SOLN
INTRAMUSCULAR | Status: AC
  Filled 2013-05-07: qty 1

## 2013-05-07 MED ORDER — SODIUM CHLORIDE 0.9 % IV SOLN
INTRAVENOUS | Status: DC
  Administered 2013-05-07: 20 mL/h via INTRAVENOUS

## 2013-05-07 MED ORDER — MIDAZOLAM HCL 10 MG/2ML IJ SOLN
INTRAMUSCULAR | Status: AC
  Filled 2013-05-07: qty 2

## 2013-05-07 MED ORDER — MIDAZOLAM HCL 2 MG/2ML IJ SOLN
INTRAMUSCULAR | Status: AC | PRN
  Administered 2013-05-07: 2 mg via INTRAVENOUS
  Administered 2013-05-07: 1 mg via INTRAVENOUS

## 2013-05-07 MED ORDER — LIDOCAINE HCL 1 % IJ SOLN
INTRAMUSCULAR | Status: AC
  Filled 2013-05-07: qty 20

## 2013-05-07 NOTE — Procedures (Signed)
BONE MARROW NOTE   Informed consent was obtained and potential risks including bleeding, infection and pain were reviewed with the patient.   Pre-procedure time out observed, Mallampati class III, ASA score II.  Bone marrow aspirate and biopsy with flow studies, cytogenetics, and FISH studies for MDS obtained from left superior posterior iliac spine under sterile conditions, 1% local lidocaine and Demerol 50 mg/Versed 4 mg IV. Pt tolerated procedure well. Good specimen obtained.   Indication: Anemia, etiology unknown.  R/O MDS, lymphoma, myeloma.  Samul Dada , MD 05/07/2013 8:53 AM

## 2013-05-13 ENCOUNTER — Telehealth: Payer: Self-pay

## 2013-05-13 NOTE — Telephone Encounter (Signed)
Cytogenetics received and forwarded to Dr Arline Asp

## 2013-05-20 ENCOUNTER — Telehealth: Payer: Self-pay

## 2013-05-20 NOTE — Telephone Encounter (Signed)
Cytogenetics report received and forwarded to Dr Arline Asp

## 2013-05-26 ENCOUNTER — Ambulatory Visit: Payer: Medicare Other | Admitting: Lab

## 2013-05-26 ENCOUNTER — Other Ambulatory Visit (HOSPITAL_BASED_OUTPATIENT_CLINIC_OR_DEPARTMENT_OTHER): Payer: Medicare Other | Admitting: Lab

## 2013-05-26 ENCOUNTER — Ambulatory Visit (HOSPITAL_BASED_OUTPATIENT_CLINIC_OR_DEPARTMENT_OTHER): Payer: Medicare Other | Admitting: Oncology

## 2013-05-26 ENCOUNTER — Encounter: Payer: Self-pay | Admitting: Oncology

## 2013-05-26 VITALS — BP 133/46 | HR 66 | Temp 98.1°F | Resp 18 | Ht <= 58 in | Wt 126.2 lb

## 2013-05-26 DIAGNOSIS — D649 Anemia, unspecified: Secondary | ICD-10-CM

## 2013-05-26 DIAGNOSIS — D472 Monoclonal gammopathy: Secondary | ICD-10-CM

## 2013-05-26 DIAGNOSIS — Z9010 Acquired absence of unspecified breast and nipple: Secondary | ICD-10-CM

## 2013-05-26 DIAGNOSIS — Z853 Personal history of malignant neoplasm of breast: Secondary | ICD-10-CM

## 2013-05-26 LAB — CBC WITH DIFFERENTIAL/PLATELET
BASO%: 0.5 % (ref 0.0–2.0)
LYMPH%: 28.4 % (ref 14.0–49.7)
MCH: 29.5 pg (ref 25.1–34.0)
MCHC: 32.7 g/dL (ref 31.5–36.0)
MCV: 90.2 fL (ref 79.5–101.0)
MONO%: 7.4 % (ref 0.0–14.0)
NEUT%: 61.7 % (ref 38.4–76.8)
Platelets: 284 10*3/uL (ref 145–400)
RBC: 2.75 10*6/uL — ABNORMAL LOW (ref 3.70–5.45)
nRBC: 1 % — ABNORMAL HIGH (ref 0–0)

## 2013-05-26 LAB — COMPREHENSIVE METABOLIC PANEL (CC13)
ALT: 10 U/L (ref 0–55)
AST: 15 U/L (ref 5–34)
Calcium: 8.8 mg/dL (ref 8.4–10.4)
Chloride: 102 mEq/L (ref 98–107)
Creatinine: 0.8 mg/dL (ref 0.6–1.1)
Potassium: 3.5 mEq/L (ref 3.5–5.1)
Sodium: 138 mEq/L (ref 136–145)
Total Protein: 7 g/dL (ref 6.4–8.3)

## 2013-05-26 MED ORDER — DARBEPOETIN ALFA-POLYSORBATE 300 MCG/0.6ML IJ SOLN
300.0000 ug | Freq: Once | INTRAMUSCULAR | Status: AC
  Administered 2013-05-26: 300 ug via SUBCUTANEOUS
  Filled 2013-05-26: qty 0.6

## 2013-05-26 NOTE — Progress Notes (Signed)
CC:   [name] [place], M.D. Janet Rotunda, MD, [facility]   PROBLEM LIST:  1. Anemia which the patient states has been present all of her life;     however, it appears that the patient's anemia has become more     severe over the past couple years starting with since 2012.  The     patient has not received any recent blood transfusions.  She     underwent a bone marrow aspirate and biopsy on 05/07/2013, which     shows hyper cellularity and erythroid hyperplasia along with some     mild dyserythropoiesis.  There were 4% plasma cells.  All told, the     findings are most consistent with an evolving refractory anemia.     Storage iron was abundant.  Cytogenetics were negative. Patient is      to be treated with Aranesp starting 05/26/2013.  2. IgG kappa monoclonal gammopathy of uncertain significance, probably  benign, detected on 05/15/2011.  3. Orthostatic lightheadedness.  4. History of DCIS involving the left breast, status post sentinel  lymph node dissection which was negative and subsequently a  prophylactic right mastectomy along with left-sided mastectomy on  10/09/2006. The patient has therefore had bilateral mastectomies.  5. History of coronary artery disease status post 4 stents, most  recent cardiac catheterization was on 04/11/2011.  6. Hypertension.  7. Dyslipidemia.  8. Hypothyroidism.  9. History of lupus in the past.  10. Psoriasis.  11. Recurrent urinary tract infections.  12. Soft systolic ejection murmur.  13. Numbness and pain of feet and legs. 14. Cystocele.   MEDICATIONS:  Reviewed and recorded. Current Outpatient Prescriptions  Medication Sig Dispense Refill  . amLODipine (NORVASC) 10 MG tablet Take 10 mg by mouth every morning.      Marland Kitchen aspirin 81 MG tablet Take 81 mg by mouth at bedtime.       . clopidogrel (PLAVIX) 75 MG tablet Take 75 mg by mouth every morning.      . fluocinonide (LIDEX) 0.05 % cream Apply 1 application topically 2 (two) times daily.        . fluticasone (FLONASE) 50 MCG/ACT nasal spray Place 2 sprays into the nose daily as needed for allergies.       Marland Kitchen levothyroxine (SYNTHROID, LEVOTHROID) 100 MCG tablet Take 100 mcg by mouth daily before breakfast.       . lisinopril-hydrochlorothiazide (PRINZIDE,ZESTORETIC) 20-25 MG per tablet Take 1 tablet by mouth every morning.      . Magnesium 500 MG TABS Take 500 mg by mouth daily.      . Multiple Vitamins-Iron (MULTIVITAMINS WITH IRON) TABS Take 1 tablet by mouth daily.      . nitroGLYCERIN (NITROSTAT) 0.4 MG SL tablet Place 0.4 mg under the tongue every 5 (five) minutes as needed for chest pain.      . polyethylene glycol (MIRALAX / GLYCOLAX) packet Take 17 g by mouth daily.      . polyvinyl alcohol (LIQUIFILM TEARS) 1.4 % ophthalmic solution Place 1 drop into both eyes 2 (two) times daily.       . rosuvastatin (CRESTOR) 20 MG tablet Take 20 mg by mouth daily at 12 noon.      . solifenacin (VESICARE) 5 MG tablet Take 5 mg by mouth daily.       . [DISCONTINUED] omeprazole (PRILOSEC) 20 MG capsule Take 20 mg by mouth daily.         No current facility-administered medications for this  visit.    TREATMENT PROGRAM:   -Aranesp 300 mcg subcutaneous every 2 weeks for hemoglobin less than 11 starting on 05/26/2013.  SOCIAL HISTORY:  Patient has never smoked cigarettes.   HISTORY:  I saw [name] today for followup of her anemia, for which she underwent a bone marrow on 05/07/2013 with findings that were most suggestive of a refractory anemia.  Cytogenetics were negative. The patient also is followed here for an IgG kappa monoclonal gammopathy, felt to be most likely benign and a history of DCIS involving the left breast.  The patient has subsequently undergone bilateral mastectomies.  No evidence for recurrence.  The patient is here today with her daughter-in-law [name].  She was last seen by [place] on 03/16/2013.  The patient denies any changes in her condition.  Her constant  complaint is 1 of dizziness which she has had for many years.  This seems to be related to some movement.  The patient's dizziness preceded onset of the worsening of her anemia. Patient denies any dyspnea on exertion, lack of energy or exertional chest discomfort.  PHYSICAL EXAMINATION:  General:  There is little change.  The patient is 79 years old, looks fairly healthy for her age.  She gets up onto the examining table without any assistance.  Vital signs:  Weight is 126 pounds, 3.2 ounces, height 4 feet 9 inches, body surface area 1.52 sq meters, blood pressure133/46.  Other vital signs are normal.  O2 saturation on room air at rest was 100%.  HEENT:  There is no scleral icterus.  Mouth and pharynx are benign.  There is no peripheral adenopathy palpable.  Lungs:  Clear to percussion and auscultation. Cardiac exam:  Regular rhythm with systolic ejection murmur.  Breasts: The patient has undergone bilateral mastectomies.  No evidence for chest wall recurrence.  Abdomen:  Benign with no organomegaly or masses palpable.  Extremities:  No peripheral edema or clubbing.  Neurologic exam:  Is normal.  LABORATORY DATA:  Today, white count 6.5, ANC 4.0, hemoglobin 8.1 as compared with 9.0 on 05/07/2013, hematocrit 24.8 as compared with 26.4 on 05/07/2013, platelets 284,000.  Red cell indices are normal. Chemistries today were entirely normal except for a glucose of 112. Albumin was 3.5.  LDH 167.  BUN 24, creatinine 0.8.  Also pending today are haptoglobin, direct Coombs and hemoglobin electrophoresis. Examination of the peripheral smear on the patient's last visit from 03/16/2013 was unremarkable.  No significant abnormalities were seen. The red cells were unremarkable.  Retic count was 1.44% with an absolute of 41.47, which is not elevated.  Vitamin B12 level on 03/16/2013 was 426, RBC folate 1058, ferritin 225 with an iron saturation of 48%. Patient was given stool cards for occult blood;  however, these have not yet been returned.  I have encouraged the patient to bring these back. She did have negative stools on 06/19/2011.  IMAGING STUDIES:  1. Chest x-ray, 2 view, from 03/22/2010 showed minimal  chronic bronchitic changes, otherwise normal. 2. Portable chest x-ray, 1 view, from 04/12/2011 showed no active disease.   PROCEDURES:   1. Bone marrow aspirate and biopsy carried out on 04/07/2013 showed hyper cellularity with erythroid hyperplasia and mild dyserythropoiesis.  Storage iron was abundant.  There were 4% plasma cells associated with kappa light chain excess.  The findings were suggestive of an evolving refractory anemia.   IMPRESSION AND PLAN:  Ms. Friesen continues to have anemia which fortunately seems to be asymptomatic for her.  There is  no evidence of GI bleeding or hemolysis at this time.  We are going to look at hemoglobin electrophoresis, haptoglobin and DAT just to be sure that there is no hemolytic component.  The possibility the patient is having intermittent bleeding cannot be entirely excluded.  It does seem that she has evolved anemia over the past couple years that at this point seems to be most consistent with a refractory anemia.  Today's session was spent mostly in discussion and lasted about 30 minutes.  The patient is giving informed consent for Aranesp.  We will go ahead with a trial of Aranesp 300 mcg subcutaneous  every 2 weeks, to see whether the patient will respond with an improvement in hemoglobin and hematocrit and whether this will result in any symptomatic benefit. The patient currently is without complaints; however, it will be interesting to see if she does notice any difference in her clinical status if we can improve her hemoglobin and hematocrit.  We will plan to check CBC every 2 weeks and give her Aranesp 300 mcg subcutaneous  whenever the hemoglobin is less than 11.  If the Aranesp fails to accomplish a hematologic  response associated with some symptomatic benefit then we will probably discontinue the Aranesp.  We will plan to see the patient again for re-evaluation in 4 weeks, which will be around June 24th at which time we will check CBC and chemistries.    ______________________________ Samul Dada, M.D. DSM/MEDQ  D:  05/26/2013  T:  05/26/2013  Job:  [id number]

## 2013-05-26 NOTE — Progress Notes (Signed)
This office note has been dictated.  #[id number]

## 2013-05-27 ENCOUNTER — Telehealth: Payer: Self-pay | Admitting: Oncology

## 2013-05-27 ENCOUNTER — Other Ambulatory Visit (HOSPITAL_BASED_OUTPATIENT_CLINIC_OR_DEPARTMENT_OTHER): Payer: Medicare Other | Admitting: Lab

## 2013-05-27 DIAGNOSIS — D649 Anemia, unspecified: Secondary | ICD-10-CM

## 2013-05-27 LAB — CBC WITH DIFFERENTIAL/PLATELET
BASO%: 0.5 % (ref 0.0–2.0)
Basophils Absolute: 0 10*3/uL (ref 0.0–0.1)
EOS%: 1.4 % (ref 0.0–7.0)
HGB: 8.8 g/dL — ABNORMAL LOW (ref 11.6–15.9)
MCH: 30 pg (ref 25.1–34.0)
MCHC: 33.1 g/dL (ref 31.5–36.0)
RDW: 16.4 % — ABNORMAL HIGH (ref 11.2–14.5)
lymph#: 1.9 10*3/uL (ref 0.9–3.3)

## 2013-05-27 NOTE — Telephone Encounter (Signed)
Returned call to Fannie Knee re pt needing lb ASAP. Per Fannie Knee pt was to return to lb yesterday after visit but could not wait any longer. Horatio Pel appt for today @ 2:30pm. Rayburn Ma working on other appts and currently waiting to hear back from desk nurse re f/u appt.

## 2013-05-27 NOTE — Telephone Encounter (Signed)
s.w. pt daughter in law and advised on 6.24.14 appt....ok and aware

## 2013-06-03 LAB — DIRECT ANTIGLOBULIN TEST (NOT AT ARMC)
DAT (Complement): NEGATIVE
DAT IgG: NEGATIVE

## 2013-06-03 LAB — HEMOGLOBINOPATHY EVALUATION

## 2013-06-04 ENCOUNTER — Other Ambulatory Visit: Payer: Self-pay

## 2013-06-04 DIAGNOSIS — D472 Monoclonal gammopathy: Secondary | ICD-10-CM

## 2013-06-05 ENCOUNTER — Encounter: Payer: Self-pay | Admitting: Oncology

## 2013-06-10 ENCOUNTER — Other Ambulatory Visit (HOSPITAL_BASED_OUTPATIENT_CLINIC_OR_DEPARTMENT_OTHER): Payer: Medicare Other | Admitting: Lab

## 2013-06-10 ENCOUNTER — Ambulatory Visit (HOSPITAL_BASED_OUTPATIENT_CLINIC_OR_DEPARTMENT_OTHER): Payer: Medicare Other

## 2013-06-10 VITALS — BP 154/54 | HR 82 | Temp 98.6°F

## 2013-06-10 DIAGNOSIS — D649 Anemia, unspecified: Secondary | ICD-10-CM

## 2013-06-10 DIAGNOSIS — D472 Monoclonal gammopathy: Secondary | ICD-10-CM

## 2013-06-10 LAB — CBC WITH DIFFERENTIAL/PLATELET
BASO%: 1.3 % (ref 0.0–2.0)
EOS%: 2.1 % (ref 0.0–7.0)
HCT: 26.9 % — ABNORMAL LOW (ref 34.8–46.6)
LYMPH%: 27.6 % (ref 14.0–49.7)
MCH: 31 pg (ref 25.1–34.0)
MCHC: 34.6 g/dL (ref 31.5–36.0)
MCV: 89.6 fL (ref 79.5–101.0)
NEUT%: 56.7 % (ref 38.4–76.8)
Platelets: 271 10*3/uL (ref 145–400)

## 2013-06-10 MED ORDER — DARBEPOETIN ALFA-POLYSORBATE 300 MCG/0.6ML IJ SOLN
300.0000 ug | Freq: Once | INTRAMUSCULAR | Status: AC
  Administered 2013-06-10: 300 ug via SUBCUTANEOUS
  Filled 2013-06-10: qty 0.6

## 2013-06-10 NOTE — Progress Notes (Signed)
Mrs Langstaff is here for her second Aranesp injection.  States that she is feeling much better.  Has more strength and no more SOB.  States that she is sleeping better.  Feels so much better since the last appointment.

## 2013-06-10 NOTE — Patient Instructions (Addendum)
Darbepoetin Alfa injection What is this medicine? DARBEPOETIN ALFA (dar be POE e tin AL fa) helps your body make more red blood cells. It is used to treat anemia caused by chronic kidney failure and chemotherapy. This medicine may be used for other purposes; ask your health care provider or pharmacist if you have questions. What should I tell my health care provider before I take this medicine? They need to know if you have any of these conditions: -blood clotting disorders or history of blood clots -cancer patient not on chemotherapy -cystic fibrosis -heart disease, such as angina, heart failure, or a history of a heart attack -hemoglobin level of 12 g/dL or greater -high blood pressure -low levels of folate, iron, or vitamin B12 -seizures -an unusual or allergic reaction to darbepoetin, erythropoietin, albumin, hamster proteins, latex, other medicines, foods, dyes, or preservatives -pregnant or trying to get pregnant -breast-feeding How should I use this medicine? This medicine is for injection into a vein or under the skin. It is usually given by a health care professional in a hospital or clinic setting. If you get this medicine at home, you will be taught how to prepare and give this medicine. Do not shake the solution before you withdraw a dose. Use exactly as directed. Take your medicine at regular intervals. Do not take your medicine more often than directed. It is important that you put your used needles and syringes in a special sharps container. Do not put them in a trash can. If you do not have a sharps container, call your pharmacist or healthcare provider to get one. Talk to your pediatrician regarding the use of this medicine in children. While this medicine may be used in children as young as 1 year for selected conditions, precautions do apply. Overdosage: If you think you have taken too much of this medicine contact a poison control center or emergency room at once. NOTE:  This medicine is only for you. Do not share this medicine with others. What if I miss a dose? If you miss a dose, take it as soon as you can. If it is almost time for your next dose, take only that dose. Do not take double or extra doses. What may interact with this medicine? Do not take this medicine with any of the following medications: -epoetin alfa This list may not describe all possible interactions. Give your health care provider a list of all the medicines, herbs, non-prescription drugs, or dietary supplements you use. Also tell them if you smoke, drink alcohol, or use illegal drugs. Some items may interact with your medicine. What should I watch for while using this medicine? Visit your prescriber or health care professional for regular checks on your progress and for the needed blood tests and blood pressure measurements. It is especially important for the doctor to make sure your hemoglobin level is in the desired range, to limit the risk of potential side effects and to give you the best benefit. Keep all appointments for any recommended tests. Check your blood pressure as directed. Ask your doctor what your blood pressure should be and when you should contact him or her. As your body makes more red blood cells, you may need to take iron, folic acid, or vitamin B supplements. Ask your doctor or health care provider which products are right for you. If you have kidney disease continue dietary restrictions, even though this medication can make you feel better. Talk with your doctor or health care professional about the   foods you eat and the vitamins that you take. What side effects may I notice from receiving this medicine? Side effects that you should report to your doctor or health care professional as soon as possible: -allergic reactions like skin rash, itching or hives, swelling of the face, lips, or tongue -breathing problems -changes in vision -chest pain -confusion, trouble speaking  or understanding -feeling faint or lightheaded, falls -high blood pressure -muscle aches or pains -pain, swelling, warmth in the leg -rapid weight gain -severe headaches -sudden numbness or weakness of the face, arm or leg -trouble walking, dizziness, loss of balance or coordination -seizures (convulsions) -swelling of the ankles, feet, hands -unusually weak or tired Side effects that usually do not require medical attention (report to your doctor or health care professional if they continue or are bothersome): -diarrhea -fever, chills (flu-like symptoms) -headaches -nausea, vomiting -redness, stinging, or swelling at site where injected This list may not describe all possible side effects. Call your doctor for medical advice about side effects. You may report side effects to FDA at 1-800-FDA-1088. Where should I keep my medicine? Keep out of the reach of children. Store in a refrigerator between 2 and 8 degrees C (36 and 46 degrees F). Do not freeze. Do not shake. Throw away any unused portion if using a single-dose vial. Throw away any unused medicine after the expiration date. NOTE: This sheet is a summary. It may not cover all possible information. If you have questions about this medicine, talk to your doctor, pharmacist, or health care provider.  2013, [employer]. (11/30/2008 10:23:57 AM)  

## 2013-06-12 LAB — HEMOGLOBINOPATHY EVALUATION
Hgb A2 Quant: 2.7 % (ref 2.2–3.2)
Hgb A: 97.1 % (ref 96.8–97.8)

## 2013-06-23 ENCOUNTER — Ambulatory Visit (HOSPITAL_BASED_OUTPATIENT_CLINIC_OR_DEPARTMENT_OTHER): Payer: Medicare Other | Admitting: Oncology

## 2013-06-23 ENCOUNTER — Encounter: Payer: Self-pay | Admitting: Oncology

## 2013-06-23 ENCOUNTER — Other Ambulatory Visit (HOSPITAL_BASED_OUTPATIENT_CLINIC_OR_DEPARTMENT_OTHER): Payer: Medicare Other | Admitting: Lab

## 2013-06-23 ENCOUNTER — Telehealth: Payer: Self-pay | Admitting: Oncology

## 2013-06-23 ENCOUNTER — Ambulatory Visit: Payer: Medicare Other

## 2013-06-23 VITALS — BP 121/55 | HR 67 | Temp 97.8°F | Resp 18 | Ht <= 58 in | Wt 126.9 lb

## 2013-06-23 DIAGNOSIS — D472 Monoclonal gammopathy: Secondary | ICD-10-CM

## 2013-06-23 DIAGNOSIS — D649 Anemia, unspecified: Secondary | ICD-10-CM

## 2013-06-23 LAB — CBC WITH DIFFERENTIAL/PLATELET
Basophils Absolute: 0.1 10*3/uL (ref 0.0–0.1)
Eosinophils Absolute: 0.1 10*3/uL (ref 0.0–0.5)
HCT: 28.8 % — ABNORMAL LOW (ref 34.8–46.6)
HGB: 9.9 g/dL — ABNORMAL LOW (ref 11.6–15.9)
LYMPH%: 25.4 % (ref 14.0–49.7)
MONO#: 0.5 10*3/uL (ref 0.1–0.9)
NEUT%: 62.3 % (ref 38.4–76.8)
Platelets: 325 10*3/uL (ref 145–400)
WBC: 5.2 10*3/uL (ref 3.9–10.3)
lymph#: 1.3 10*3/uL (ref 0.9–3.3)

## 2013-06-23 LAB — COMPREHENSIVE METABOLIC PANEL (CC13)
AST: 14 U/L (ref 5–34)
Albumin: 3.6 g/dL (ref 3.5–5.0)
BUN: 16.6 mg/dL (ref 7.0–26.0)
CO2: 27 mEq/L (ref 22–29)
Calcium: 9.2 mg/dL (ref 8.4–10.4)
Chloride: 100 mEq/L (ref 98–107)
Creatinine: 1.1 mg/dL (ref 0.6–1.1)
Glucose: 119 mg/dl — ABNORMAL HIGH (ref 70–99)
Potassium: 4.3 mEq/L (ref 3.5–5.1)

## 2013-06-23 LAB — FECAL OCCULT BLOOD, GUAIAC: Occult Blood: NEGATIVE

## 2013-06-23 MED ORDER — DARBEPOETIN ALFA-POLYSORBATE 300 MCG/0.6ML IJ SOLN
300.0000 ug | Freq: Once | INTRAMUSCULAR | Status: AC
  Administered 2013-06-23: 300 ug via SUBCUTANEOUS
  Filled 2013-06-23: qty 0.6

## 2013-06-23 NOTE — Progress Notes (Signed)
This office note has been dictated.  #[id number]

## 2013-06-23 NOTE — Telephone Encounter (Signed)
Gave pt appt for lab injection up to September , pt will see md with labs on September 2014

## 2013-06-24 ENCOUNTER — Other Ambulatory Visit: Payer: Self-pay | Admitting: Cardiology

## 2013-06-24 NOTE — Progress Notes (Signed)
CC:   [name], M.D. Janet Rotunda, MD, [name]  PROBLEM LIST:  1. Anemia which the patient states has been present all of her life;  however, it appears that the patient's anemia has become more  severe over the past couple years starting with since 2012. The  patient has not received any recent blood transfusions. She  underwent a bone marrow aspirate and biopsy on 05/07/2013, which  shows hyper cellularity and erythroid hyperplasia along with some  mild dyserythropoiesis. There were 4% plasma cells. All told, the  findings are most consistent with an evolving refractory anemia.  Storage iron was abundant. Cytogenetics were negative. The patient  was started on Aranesp on 05/26/2013.   2. IgG kappa monoclonal gammopathy of uncertain significance, probably  benign, detected on 05/15/2011.  3. Orthostatic lightheadedness.  4. History of DCIS involving the left breast, status post sentinel  lymph node dissection which was negative and subsequently a  prophylactic right mastectomy along with left-sided mastectomy on  10/09/2006. The patient has therefore had bilateral mastectomies.  5. History of coronary artery disease status post 4 stents, most  recent cardiac catheterization was on 04/11/2011.  6. Hypertension.  7. Dyslipidemia.  8. Hypothyroidism.  9. History of lupus in the past.  10. Psoriasis.  11. Recurrent urinary tract infections.  12. Soft systolic ejection murmur.  13. Numbness and pain of feet and legs.  14. Cystocele.   MEDICATIONS:  Reviewed and recorded. Current Outpatient Prescriptions  Medication Sig Dispense Refill  . amLODipine (NORVASC) 10 MG tablet Take 10 mg by mouth every morning.      Marland Kitchen aspirin 81 MG tablet Take 81 mg by mouth at bedtime.       . clopidogrel (PLAVIX) 75 MG tablet Take 75 mg by mouth every morning.      . fluocinonide (LIDEX) 0.05 % cream Apply 1 application topically 2 (two) times daily.       . fluticasone (FLONASE) 50 MCG/ACT  nasal spray Place 2 sprays into the nose daily as needed for allergies.       Marland Kitchen levothyroxine (SYNTHROID, LEVOTHROID) 100 MCG tablet Take 100 mcg by mouth daily before breakfast.       . lisinopril-hydrochlorothiazide (PRINZIDE,ZESTORETIC) 20-25 MG per tablet Take 1 tablet by mouth every morning.      . Magnesium 500 MG TABS Take 500 mg by mouth daily.      . Multiple Vitamins-Iron (MULTIVITAMINS WITH IRON) TABS Take 1 tablet by mouth daily.      . nitroGLYCERIN (NITROSTAT) 0.4 MG SL tablet Place 0.4 mg under the tongue every 5 (five) minutes as needed for chest pain.      . polyethylene glycol (MIRALAX / GLYCOLAX) packet Take 17 g by mouth daily.      . polyvinyl alcohol (LIQUIFILM TEARS) 1.4 % ophthalmic solution Place 1 drop into both eyes 2 (two) times daily.       . rosuvastatin (CRESTOR) 20 MG tablet Take 20 mg by mouth daily at 12 noon.      . solifenacin (VESICARE) 5 MG tablet Take 5 mg by mouth daily.       . CRESTOR 20 MG tablet TAKE ONE TABLET (20 MG TOTAL) BY MOUTH ONCE DAILY.  30 tablet  1  . [DISCONTINUED] omeprazole (PRILOSEC) 20 MG capsule Take 20 mg by mouth daily.         No current facility-administered medications for this visit.    TREATMENT PROGRAM:  -Aranesp 300 mcg subcutaneous every  2 weeks for hemoglobin less than 11, started on 05/26/2013.  SMOKING HISTORY:  The patient has never smoked cigarettes.    HISTORY:  [name] was seen today for followup of her anemia with bone marrow on 05/07/2013 suggesting a refractory anemia.  Cytogenetics were negative.  We also follow the patient for an IgG kappa monoclonal gammopathy as well as a history of DCIS involving the left breast.  The patient has undergone bilateral mastectomies.  The patient was last seen by [place] on 05/26/2013, at which time she was started on Aranesp 300 mcg subcu with the intention to give this to her every 2 weeks for a hemoglobin less than 11.  She is accompanied today by her  daughter-in-law.  The patient received a second dose of Aranesp on 06/11.  She has had no reactions to the medicine.  In fact, the patient states that she feels fine, much improved.  She says she has a lot of energy.  She is no longer having the dizziness that she used to complain of.  I asked how long she has had the dizziness, she says for a long time, "forever."  It is interesting to note that back 2 years ago the patient's hemoglobin was running in the 11 range.  Apparently, she was symptomatic at that time.  The patient states that a few weeks ago she reached for her alarm clock and may have pulled a muscle in her left chest.  This is aggravated when she lies down.  PHYSICAL EXAMINATION:  The patient looks well.  She is a very spry 79- year-old.  Weight is 126 pounds.  Height 4 feet 9 inches.  Body surface area 1.52 m2.  Blood pressure 121/55.  Other vital signs are normal. There is no scleral icterus.  Mouth and pharynx are benign.  There is no peripheral adenopathy palpable.  Lungs are clear to percussion and auscultation.  Cardiac Exam:  Regular rhythm with systolic ejection murmur.  The patient has undergone bilateral mastectomies.  Abdomen is benign, although I thought I could appreciate a palpable liver a couple of centimeters below the right costal margin with inspiration.  I could not feel a sharp liver edge.  No splenomegaly.  No abdominal masses or ascites.  Extremities:  No peripheral edema or clubbing.  Neurologic exam is normal.  The patient is able to get up and down from the examining table without any assistance.  LABORATORY DATA:  Today, white count 5.2, ANC 3.2, hemoglobin 9.9, hematocrit 28.8, platelets 325,000.  The patient's hemoglobin on 05/26/2013 was 8.1.  Chemistries from today notable for a total bilirubin of 1.45, LDH 176, albumin 3.6, BUN 17, creatinine 1.1. Hemoglobin electrophoresis, haptoglobin and DAT were all within normal limits.  IgG level on  05/26/2013 was 1290.  On 03/16/2013, ferritin was 225 and red cell folate 1058.  IMAGING STUDIES:  1. Chest x-ray, 2 view, from 03/22/2010 showed minimal  chronic bronchitic changes, otherwise normal.  2. Portable chest x-ray, 1 view, from 04/12/2011 showed no active disease.    PROCEDURES:  1. Bone marrow aspirate and biopsy carried out on 04/07/2013  showed hyper cellularity with erythroid hyperplasia and mild  dyserythropoiesis. Storage iron was abundant. There were 4% plasma  cells associated with kappa light chain excess. The findings were  suggestive of an evolving refractory anemia.   IMPRESSION AND PLAN:  Clinically, [name] is markedly improved, as is her hemoglobin which has risen from 8.1 up to 9.9 today.  The patient  will receive another dose of Aranesp 300 mcg subcu.  We will continue to check CBC every 2 weeks and give Aranesp 300 mcg subcu for a hemoglobin less than 11.  We will plan to see the patient again in 3 months, at which time we will check CBC and chemistries.  If the patient does well with this program, and especially if she is running hemoglobins over 11 and therefore not requiring Aranesp every 2 weeks, then attempts can be made to increase the interval from every 2 weeks to perhaps every 3 or 4 weeks.  This was explained to the patient and her daughter-in-law.  I should also mention that the patient brought in stool cards today, and that they were negative x3; thus, we do not have any evidence for hemolysis or GI bleeding at this time.    ______________________________ Samul Dada, M.D. DSM/MEDQ  D:  06/23/2013  T:  06/24/2013  Job:  [id number]

## 2013-07-08 ENCOUNTER — Other Ambulatory Visit (HOSPITAL_BASED_OUTPATIENT_CLINIC_OR_DEPARTMENT_OTHER): Payer: Medicare Other | Admitting: Lab

## 2013-07-08 ENCOUNTER — Ambulatory Visit (HOSPITAL_BASED_OUTPATIENT_CLINIC_OR_DEPARTMENT_OTHER): Payer: Medicare Other

## 2013-07-08 VITALS — BP 125/61 | HR 92 | Temp 98.1°F

## 2013-07-08 DIAGNOSIS — D649 Anemia, unspecified: Secondary | ICD-10-CM

## 2013-07-08 LAB — CBC WITH DIFFERENTIAL/PLATELET
Basophils Absolute: 0 10*3/uL (ref 0.0–0.1)
EOS%: 2.1 % (ref 0.0–7.0)
HCT: 30.6 % — ABNORMAL LOW (ref 34.8–46.6)
HGB: 9.9 g/dL — ABNORMAL LOW (ref 11.6–15.9)
LYMPH%: 26.2 % (ref 14.0–49.7)
MCH: 29.1 pg (ref 25.1–34.0)
MCV: 90 fL (ref 79.5–101.0)
MONO%: 7.9 % (ref 0.0–14.0)
NEUT%: 63 % (ref 38.4–76.8)

## 2013-07-08 MED ORDER — DARBEPOETIN ALFA-POLYSORBATE 300 MCG/0.6ML IJ SOLN
300.0000 ug | Freq: Once | INTRAMUSCULAR | Status: AC
  Administered 2013-07-08: 300 ug via SUBCUTANEOUS
  Filled 2013-07-08: qty 0.6

## 2013-07-08 NOTE — Patient Instructions (Addendum)
Darbepoetin Alfa injection What is this medicine? DARBEPOETIN ALFA (dar be POE e tin AL fa) helps your body make more red blood cells. It is used to treat anemia caused by chronic kidney failure and chemotherapy. This medicine may be used for other purposes; ask your health care provider or pharmacist if you have questions. What should I tell my health care provider before I take this medicine? They need to know if you have any of these conditions: -blood clotting disorders or history of blood clots -cancer patient not on chemotherapy -cystic fibrosis -heart disease, such as angina, heart failure, or a history of a heart attack -hemoglobin level of 12 g/dL or greater -high blood pressure -low levels of folate, iron, or vitamin B12 -seizures -an unusual or allergic reaction to darbepoetin, erythropoietin, albumin, hamster proteins, latex, other medicines, foods, dyes, or preservatives -pregnant or trying to get pregnant -breast-feeding How should I use this medicine? This medicine is for injection into a vein or under the skin. It is usually given by a health care professional in a hospital or clinic setting. If you get this medicine at home, you will be taught how to prepare and give this medicine. Do not shake the solution before you withdraw a dose. Use exactly as directed. Take your medicine at regular intervals. Do not take your medicine more often than directed. It is important that you put your used needles and syringes in a special sharps container. Do not put them in a trash can. If you do not have a sharps container, call your pharmacist or healthcare provider to get one. Talk to your pediatrician regarding the use of this medicine in children. While this medicine may be used in children as young as 1 year for selected conditions, precautions do apply. Overdosage: If you think you have taken too much of this medicine contact a poison control center or emergency room at once. NOTE:  This medicine is only for you. Do not share this medicine with others. What if I miss a dose? If you miss a dose, take it as soon as you can. If it is almost time for your next dose, take only that dose. Do not take double or extra doses. What may interact with this medicine? Do not take this medicine with any of the following medications: -epoetin alfa This list may not describe all possible interactions. Give your health care provider a list of all the medicines, herbs, non-prescription drugs, or dietary supplements you use. Also tell them if you smoke, drink alcohol, or use illegal drugs. Some items may interact with your medicine. What should I watch for while using this medicine? Visit your prescriber or health care professional for regular checks on your progress and for the needed blood tests and blood pressure measurements. It is especially important for the doctor to make sure your hemoglobin level is in the desired range, to limit the risk of potential side effects and to give you the best benefit. Keep all appointments for any recommended tests. Check your blood pressure as directed. Ask your doctor what your blood pressure should be and when you should contact him or her. As your body makes more red blood cells, you may need to take iron, folic acid, or vitamin B supplements. Ask your doctor or health care provider which products are right for you. If you have kidney disease continue dietary restrictions, even though this medication can make you feel better. Talk with your doctor or health care professional about the   foods you eat and the vitamins that you take. What side effects may I notice from receiving this medicine? Side effects that you should report to your doctor or health care professional as soon as possible: -allergic reactions like skin rash, itching or hives, swelling of the face, lips, or tongue -breathing problems -changes in vision -chest pain -confusion, trouble speaking  or understanding -feeling faint or lightheaded, falls -high blood pressure -muscle aches or pains -pain, swelling, warmth in the leg -rapid weight gain -severe headaches -sudden numbness or weakness of the face, arm or leg -trouble walking, dizziness, loss of balance or coordination -seizures (convulsions) -swelling of the ankles, feet, hands -unusually weak or tired Side effects that usually do not require medical attention (report to your doctor or health care professional if they continue or are bothersome): -diarrhea -fever, chills (flu-like symptoms) -headaches -nausea, vomiting -redness, stinging, or swelling at site where injected This list may not describe all possible side effects. Call your doctor for medical advice about side effects. You may report side effects to FDA at 1-800-FDA-1088. Where should I keep my medicine? Keep out of the reach of children. Store in a refrigerator between 2 and 8 degrees C (36 and 46 degrees F). Do not freeze. Do not shake. Throw away any unused portion if using a single-dose vial. Throw away any unused medicine after the expiration date. NOTE: This sheet is a summary. It may not cover all possible information. If you have questions about this medicine, talk to your doctor, pharmacist, or health care provider.  2013, [employer]. (11/30/2008 10:23:57 AM)  

## 2013-07-22 ENCOUNTER — Ambulatory Visit: Payer: Medicare Other

## 2013-07-22 ENCOUNTER — Other Ambulatory Visit (HOSPITAL_BASED_OUTPATIENT_CLINIC_OR_DEPARTMENT_OTHER): Payer: Medicare Other | Admitting: Lab

## 2013-07-22 DIAGNOSIS — D649 Anemia, unspecified: Secondary | ICD-10-CM

## 2013-07-22 LAB — CBC WITH DIFFERENTIAL/PLATELET
Eosinophils Absolute: 0.1 10*3/uL (ref 0.0–0.5)
LYMPH%: 34.6 % (ref 14.0–49.7)
MCH: 30.3 pg (ref 25.1–34.0)
MCHC: 33.8 g/dL (ref 31.5–36.0)
MCV: 89.6 fL (ref 79.5–101.0)
MONO%: 11.2 % (ref 0.0–14.0)
NEUT#: 2.3 10*3/uL (ref 1.5–6.5)
Platelets: 296 10*3/uL (ref 145–400)
RBC: 3.62 10*6/uL — ABNORMAL LOW (ref 3.70–5.45)

## 2013-07-22 MED ORDER — DARBEPOETIN ALFA-POLYSORBATE 300 MCG/0.6ML IJ SOLN: 300.0000 ug | Freq: Once | INTRAMUSCULAR | Status: DC

## 2013-07-22 NOTE — Progress Notes (Signed)
Hgb 11.0 today.  Aranesp held.

## 2013-08-05 ENCOUNTER — Other Ambulatory Visit: Payer: Self-pay

## 2013-08-05 ENCOUNTER — Other Ambulatory Visit (HOSPITAL_BASED_OUTPATIENT_CLINIC_OR_DEPARTMENT_OTHER): Payer: Medicare Other | Admitting: Lab

## 2013-08-05 ENCOUNTER — Ambulatory Visit (HOSPITAL_BASED_OUTPATIENT_CLINIC_OR_DEPARTMENT_OTHER): Payer: Medicare Other

## 2013-08-05 VITALS — BP 154/64 | HR 76 | Temp 98.5°F

## 2013-08-05 DIAGNOSIS — D649 Anemia, unspecified: Secondary | ICD-10-CM

## 2013-08-05 LAB — CBC WITH DIFFERENTIAL/PLATELET
Basophils Absolute: 0 10*3/uL (ref 0.0–0.1)
EOS%: 3.1 % (ref 0.0–7.0)
Eosinophils Absolute: 0.2 10*3/uL (ref 0.0–0.5)
MONO#: 0.6 10*3/uL (ref 0.1–0.9)
Platelets: 286 10*3/uL (ref 145–400)
RDW: 17 % — ABNORMAL HIGH (ref 11.2–14.5)
lymph#: 1.8 10*3/uL (ref 0.9–3.3)

## 2013-08-05 MED ORDER — DARBEPOETIN ALFA-POLYSORBATE 300 MCG/0.6ML IJ SOLN
300.0000 ug | Freq: Once | INTRAMUSCULAR | Status: AC
  Administered 2013-08-05: 300 ug via SUBCUTANEOUS
  Filled 2013-08-05: qty 0.6

## 2013-08-14 ENCOUNTER — Other Ambulatory Visit: Payer: Self-pay | Admitting: Physician Assistant

## 2013-08-19 ENCOUNTER — Other Ambulatory Visit (HOSPITAL_BASED_OUTPATIENT_CLINIC_OR_DEPARTMENT_OTHER): Payer: Medicare Other | Admitting: Lab

## 2013-08-19 ENCOUNTER — Ambulatory Visit (HOSPITAL_BASED_OUTPATIENT_CLINIC_OR_DEPARTMENT_OTHER): Payer: Medicare Other

## 2013-08-19 VITALS — BP 133/44 | HR 69 | Temp 98.1°F

## 2013-08-19 DIAGNOSIS — D649 Anemia, unspecified: Secondary | ICD-10-CM

## 2013-08-19 LAB — CBC WITH DIFFERENTIAL/PLATELET
Basophils Absolute: 0.1 10*3/uL (ref 0.0–0.1)
Eosinophils Absolute: 0.2 10*3/uL (ref 0.0–0.5)
HCT: 28.5 % — ABNORMAL LOW (ref 34.8–46.6)
HGB: 9.7 g/dL — ABNORMAL LOW (ref 11.6–15.9)
MCV: 87.7 fL (ref 79.5–101.0)
MONO%: 10.4 % (ref 0.0–14.0)
NEUT#: 2.6 10*3/uL (ref 1.5–6.5)
NEUT%: 55 % (ref 38.4–76.8)
Platelets: 253 10*3/uL (ref 145–400)
RDW: 18.1 % — ABNORMAL HIGH (ref 11.2–14.5)

## 2013-08-19 MED ORDER — DARBEPOETIN ALFA-POLYSORBATE 300 MCG/0.6ML IJ SOLN
300.0000 ug | Freq: Once | INTRAMUSCULAR | Status: AC
  Administered 2013-08-19: 300 ug via SUBCUTANEOUS
  Filled 2013-08-19: qty 0.6

## 2013-09-02 ENCOUNTER — Other Ambulatory Visit (HOSPITAL_BASED_OUTPATIENT_CLINIC_OR_DEPARTMENT_OTHER): Payer: Medicare Other | Admitting: Lab

## 2013-09-02 ENCOUNTER — Ambulatory Visit (HOSPITAL_BASED_OUTPATIENT_CLINIC_OR_DEPARTMENT_OTHER): Payer: Medicare Other

## 2013-09-02 VITALS — BP 149/74 | HR 76 | Temp 98.3°F

## 2013-09-02 DIAGNOSIS — D649 Anemia, unspecified: Secondary | ICD-10-CM

## 2013-09-02 LAB — CBC WITH DIFFERENTIAL/PLATELET
Basophils Absolute: 0.1 10*3/uL (ref 0.0–0.1)
Eosinophils Absolute: 0.1 10*3/uL (ref 0.0–0.5)
LYMPH%: 31.1 % (ref 14.0–49.7)
MCV: 87.2 fL (ref 79.5–101.0)
MONO%: 13.1 % (ref 0.0–14.0)
NEUT#: 2.2 10*3/uL (ref 1.5–6.5)
Platelets: 322 10*3/uL (ref 145–400)
RBC: 3.59 10*6/uL — ABNORMAL LOW (ref 3.70–5.45)

## 2013-09-02 MED ORDER — DARBEPOETIN ALFA-POLYSORBATE 300 MCG/0.6ML IJ SOLN
300.0000 ug | Freq: Once | INTRAMUSCULAR | Status: AC
  Administered 2013-09-02: 300 ug via SUBCUTANEOUS
  Filled 2013-09-02: qty 0.6

## 2013-09-09 ENCOUNTER — Other Ambulatory Visit: Payer: Self-pay

## 2013-09-09 ENCOUNTER — Telehealth: Payer: Self-pay

## 2013-09-09 MED ORDER — LISINOPRIL-HYDROCHLOROTHIAZIDE 20-25 MG PO TABS: 1.0000 | ORAL_TABLET | Freq: Every morning | ORAL | Status: DC

## 2013-09-09 MED ORDER — CLOPIDOGREL BISULFATE 75 MG PO TABS: 75.0000 mg | ORAL_TABLET | Freq: Every morning | ORAL | Status: DC

## 2013-09-09 NOTE — Telephone Encounter (Signed)
This is now a patient of Dr Antoine Poche, last seen by him in Aug 2013, refills will need to be through him

## 2013-09-15 ENCOUNTER — Other Ambulatory Visit: Payer: Self-pay

## 2013-09-15 DIAGNOSIS — D472 Monoclonal gammopathy: Secondary | ICD-10-CM

## 2013-09-16 ENCOUNTER — Encounter: Payer: Self-pay | Admitting: Internal Medicine

## 2013-09-16 ENCOUNTER — Other Ambulatory Visit (HOSPITAL_BASED_OUTPATIENT_CLINIC_OR_DEPARTMENT_OTHER): Payer: Medicare Other | Admitting: Lab

## 2013-09-16 ENCOUNTER — Telehealth: Payer: Self-pay | Admitting: *Deleted

## 2013-09-16 ENCOUNTER — Other Ambulatory Visit: Payer: Medicare Other | Admitting: Lab

## 2013-09-16 ENCOUNTER — Ambulatory Visit (HOSPITAL_BASED_OUTPATIENT_CLINIC_OR_DEPARTMENT_OTHER): Payer: Medicare Other | Admitting: Internal Medicine

## 2013-09-16 ENCOUNTER — Ambulatory Visit (HOSPITAL_BASED_OUTPATIENT_CLINIC_OR_DEPARTMENT_OTHER): Payer: Medicare Other

## 2013-09-16 VITALS — BP 136/99 | HR 81 | Temp 97.7°F | Resp 18 | Ht <= 58 in | Wt 124.9 lb

## 2013-09-16 DIAGNOSIS — D472 Monoclonal gammopathy: Secondary | ICD-10-CM

## 2013-09-16 DIAGNOSIS — D649 Anemia, unspecified: Secondary | ICD-10-CM

## 2013-09-16 LAB — CBC WITH DIFFERENTIAL/PLATELET
BASO%: 1 % (ref 0.0–2.0)
EOS%: 2.9 % (ref 0.0–7.0)
LYMPH%: 31.7 % (ref 14.0–49.7)
MCH: 28.9 pg (ref 25.1–34.0)
MCHC: 33.3 g/dL (ref 31.5–36.0)
MCV: 87 fL (ref 79.5–101.0)
MONO#: 0.6 10*3/uL (ref 0.1–0.9)
MONO%: 10.6 % (ref 0.0–14.0)
NEUT%: 53.8 % (ref 38.4–76.8)
Platelets: 334 10*3/uL (ref 145–400)
RBC: 3.77 10*6/uL (ref 3.70–5.45)
WBC: 5.5 10*3/uL (ref 3.9–10.3)

## 2013-09-16 LAB — COMPREHENSIVE METABOLIC PANEL (CC13)
ALT: 10 U/L (ref 0–55)
AST: 16 U/L (ref 5–34)
Alkaline Phosphatase: 44 U/L (ref 40–150)
Creatinine: 0.8 mg/dL (ref 0.6–1.1)
Sodium: 140 mEq/L (ref 136–145)
Total Bilirubin: 1.73 mg/dL — ABNORMAL HIGH (ref 0.20–1.20)
Total Protein: 7.6 g/dL (ref 6.4–8.3)

## 2013-09-16 MED ORDER — DARBEPOETIN ALFA-POLYSORBATE 300 MCG/0.6ML IJ SOLN
300.0000 ug | Freq: Once | INTRAMUSCULAR | Status: AC
  Administered 2013-09-16: 300 ug via SUBCUTANEOUS
  Filled 2013-09-16: qty 0.6

## 2013-09-16 NOTE — Telephone Encounter (Signed)
appts made and printed...td 

## 2013-09-16 NOTE — Patient Instructions (Addendum)
Darbepoetin Alfa injection What is this medicine? DARBEPOETIN ALFA (dar be POE e tin AL fa) helps your body make more red blood cells. It is used to treat anemia caused by chronic kidney failure and chemotherapy. This medicine may be used for other purposes; ask your health care provider or pharmacist if you have questions. What should I tell my health care provider before I take this medicine? They need to know if you have any of these conditions: -blood clotting disorders or history of blood clots -cancer patient not on chemotherapy -cystic fibrosis -heart disease, such as angina, heart failure, or a history of a heart attack -hemoglobin level of 12 g/dL or greater -high blood pressure -low levels of folate, iron, or vitamin B12 -seizures -an unusual or allergic reaction to darbepoetin, erythropoietin, albumin, hamster proteins, latex, other medicines, foods, dyes, or preservatives -pregnant or trying to get pregnant -breast-feeding How should I use this medicine? This medicine is for injection into a vein or under the skin. It is usually given by a health care professional in a hospital or clinic setting. If you get this medicine at home, you will be taught how to prepare and give this medicine. Do not shake the solution before you withdraw a dose. Use exactly as directed. Take your medicine at regular intervals. Do not take your medicine more often than directed. It is important that you put your used needles and syringes in a special sharps container. Do not put them in a trash can. If you do not have a sharps container, call your pharmacist or healthcare provider to get one. Talk to your pediatrician regarding the use of this medicine in children. While this medicine may be used in children as young as 1 year for selected conditions, precautions do apply. Overdosage: If you think you have taken too much of this medicine contact a poison control center or emergency room at once. NOTE:  This medicine is only for you. Do not share this medicine with others. What if I miss a dose? If you miss a dose, take it as soon as you can. If it is almost time for your next dose, take only that dose. Do not take double or extra doses. What may interact with this medicine? Do not take this medicine with any of the following medications: -epoetin alfa This list may not describe all possible interactions. Give your health care provider a list of all the medicines, herbs, non-prescription drugs, or dietary supplements you use. Also tell them if you smoke, drink alcohol, or use illegal drugs. Some items may interact with your medicine. What should I watch for while using this medicine? Visit your prescriber or health care professional for regular checks on your progress and for the needed blood tests and blood pressure measurements. It is especially important for the doctor to make sure your hemoglobin level is in the desired range, to limit the risk of potential side effects and to give you the best benefit. Keep all appointments for any recommended tests. Check your blood pressure as directed. Ask your doctor what your blood pressure should be and when you should contact him or her. As your body makes more red blood cells, you may need to take iron, folic acid, or vitamin B supplements. Ask your doctor or health care provider which products are right for you. If you have kidney disease continue dietary restrictions, even though this medication can make you feel better. Talk with your doctor or health care professional about the   foods you eat and the vitamins that you take. What side effects may I notice from receiving this medicine? Side effects that you should report to your doctor or health care professional as soon as possible: -allergic reactions like skin rash, itching or hives, swelling of the face, lips, or tongue -breathing problems -changes in vision -chest pain -confusion, trouble speaking  or understanding -feeling faint or lightheaded, falls -high blood pressure -muscle aches or pains -pain, swelling, warmth in the leg -rapid weight gain -severe headaches -sudden numbness or weakness of the face, arm or leg -trouble walking, dizziness, loss of balance or coordination -seizures (convulsions) -swelling of the ankles, feet, hands -unusually weak or tired Side effects that usually do not require medical attention (report to your doctor or health care professional if they continue or are bothersome): -diarrhea -fever, chills (flu-like symptoms) -headaches -nausea, vomiting -redness, stinging, or swelling at site where injected This list may not describe all possible side effects. Call your doctor for medical advice about side effects. You may report side effects to FDA at 1-800-FDA-1088. Where should I keep my medicine? Keep out of the reach of children. Store in a refrigerator between 2 and 8 degrees C (36 and 46 degrees F). Do not freeze. Do not shake. Throw away any unused portion if using a single-dose vial. Throw away any unused medicine after the expiration date. NOTE: This sheet is a summary. It may not cover all possible information. If you have questions about this medicine, talk to your doctor, pharmacist, or health care provider.  2013, [employer]. (11/30/2008 10:23:57 AM)  Anemia, Frequently Asked Questions WHAT ARE THE SYMPTOMS OF ANEMIA?  Headache.  Difficulty thinking.  Fatigue.  Shortness of breath.  Weakness.  Rapid heartbeat. AT WHAT POINT ARE PEOPLE CONSIDERED ANEMIC?  This varies with gender and age.   Both hemoglobin (Hgb) and hematocrit values are used to define anemia. These lab values are obtained from a complete blood count (CBC) test. This is performed at a caregiver's office.  The normal range of hemoglobin values for adult men is 14.0 g/dL to 91.4 g/dL. For nonpregnant women, values are 12.3 g/dL to 78.2 g/dL.  The World  Health Organization defines anemia as less than 12 g/dL for nonpregnant women and less than 13 g/dL for men.  For adult males, the average normal hematocrit is 46%, and the range is 40% to 52%.  For adult females, the average normal hematocrit is 41%, and the range is 35% to 47%.  Values that fall below the lower limits can be a sign of anemia and should have further checking (evaluation). GROUPS OF PEOPLE WHO ARE AT RISK FOR DEVELOPING ANEMIA INCLUDE:   Infants who are breastfed or taking a formula that is not fortified with iron.  Children going through a rapid growth spurt. The iron available can not keep up with the needs for a red cell mass which must grow with the child.  Women in childbearing years. They need iron because of blood loss during menstruation.  Pregnant women. The growing fetus creates a high demand for iron.  People with ongoing gastrointestinal blood loss are at risk of developing iron deficiency.  Individuals with leukemia or cancer who must receive chemotherapy or radiation to treat their disease. The drugs or radiation used to treat these diseases often decreases the bone marrow's ability to make cells of all classes. This includes red blood cells, white blood cells, and platelets.  Individuals with chronic inflammatory conditions such as rheumatoid  arthritis or chronic infections.  The elderly. ARE SOME TYPES OF ANEMIA INHERITED?   Yes, some types of anemia are due to inherited or genetic defects.  Sickle cell anemia. This occurs most often in people of African, African American, and Mediterranean descent.  Thalassemia (or Cooley's anemia). This type is found in people of Mediterranean and Southeast Asian descent. These types of anemia are common.  [place]. This is rare. CAN CERTAIN MEDICATIONS CAUSE A PERSON TO BECOME ANEMIC?  Yes. For example, drugs to fight cancer (chemotherapeutic agents) often cause anemia. These drugs can slow the bone marrow's  ability to make red blood cells. If there are not enough red blood cells, the body does not get enough oxygen. WHAT HEMATOCRIT LEVEL IS REQUIRED TO DONATE BLOOD?  The lower limit of an acceptable hematocrit for blood donors is 38%. If you have a low hematocrit value, you should schedule an appointment with your caregiver. ARE BLOOD TRANSFUSIONS COMMONLY USED TO CORRECT ANEMIA, AND ARE THEY DANGEROUS?  They are used to treat anemia as a last resort. Your caregiver will find the cause of the anemia and correct it if possible. Most blood transfusions are given because of excessive bleeding at the time of surgery, with trauma, or because of bone marrow suppression in patients with cancer or leukemia on chemotherapy. Blood transfusions are safer than ever before. We also know that blood transfusions affect the immune system and may increase certain risks. There is also a concern for human error. In 1/16,000 transfusions, a patient receives a transfusion of blood that is not matched with his or her blood type.  WHAT IS IRON DEFICIENCY ANEMIA AND CAN I CORRECT IT BY CHANGING MY DIET?  Iron is an essential part of hemoglobin. Without enough hemoglobin, anemia develops and the body does not get the right amount of oxygen. Iron deficiency anemia develops after the body has had a low level of iron for a long time. This is either caused by blood loss, not taking in or absorbing enough iron, or increased demands for iron (like pregnancy or rapid growth).  Foods from animal origin such as beef, chicken, and pork, are good sources of iron. Be sure to have one of these foods at each meal. Vitamin C helps your body absorb iron. Foods rich in Vitamin C include citrus, bell pepper, strawberries, spinach and cantaloupe. In some cases, iron supplements may be needed in order to correct the iron deficiency. In the case of poor absorption, extra iron may have to be given directly into the vein through a needle (intravenously). I  HAVE BEEN DIAGNOSED WITH IRON DEFICIENCY ANEMIA AND MY CAREGIVER PRESCRIBED IRON SUPPLEMENTS. HOW LONG WILL IT TAKE FOR MY BLOOD TO BECOME NORMAL?  It depends on the degree of anemia at the beginning of treatment. Most people with mild to moderate iron deficiency, anemia will correct the anemia over a period of 2 to 3 months. But after the anemia is corrected, the iron stored by the body is still low. Caregivers often suggest an additional 6 months of oral iron therapy once the anemia has been reversed. This will help prevent the iron deficiency anemia from quickly happening again. Non-anemic adult males should take iron supplements only under the direction of a doctor, too much iron can cause liver damage.  MY HEMOGLOBIN IS 9 G/DL AND I AM SCHEDULED FOR SURGERY. SHOULD I POSTPONE THE SURGERY?  If you have Hgb of 9, you should discuss this with your caregiver right away. Many patients  with similar hemoglobin levels have had surgery without problems. If minimal blood loss is expected for a minor procedure, no treatment may be necessary.  If a greater blood loss is expected for more extensive procedures, you should ask your caregiver about being treated with erythropoietin and iron. This is to accelerate the recovery of your hemoglobin to a normal level before surgery. An anemic patient who undergoes high-blood-loss surgery has a greater risk of surgical complications and need for a blood transfusion, which also carries some risk.  I HAVE BEEN TOLD THAT HEAVY MENSTRUAL PERIODS CAUSE ANEMIA. IS THERE ANYTHING I CAN DO TO PREVENT THE ANEMIA?  Anemia that results from heavy periods is usually due to iron deficiency. You can try to meet the increased demands for iron caused by the heavy monthly blood loss by increasing the intake of iron-rich foods. Iron supplements may be required. Discuss your concerns with your caregiver. WHAT CAUSES ANEMIA DURING PREGNANCY?  Pregnancy places major demands on the body. The  mother must meet the needs of both her body and her growing baby. The body needs enough iron and folate to make the right amount of red blood cells. To prevent anemia while pregnant, the mother should stay in close contact with her caregiver.  Be sure to eat a diet that has foods rich in iron and folate like liver and dark green leafy vegetables. Folate plays an important role in the normal development of a baby's spinal cord. Folate can help prevent serious disorders like spina bifida. If your diet does not provide adequate nutrients, you may want to talk with your caregiver about nutritional supplements.  WHAT IS THE RELATIONSHIP BETWEEN FIBROID TUMORS AND ANEMIA IN WOMEN?  The relationship is usually caused by the increased menstrual blood loss caused by fibroids. Good iron intake may be required to prevent iron deficiency anemia from developing.  Document Released: 07/25/2004 Document Revised: 03/10/2012 Document Reviewed: 01/09/2011 [facility] Patient Information 2014 [place], [place].     RTC in 3 months for an office visit. Return to the lab monthly or every 4 weeks for CBC and aranesp injection if hemogloblin less than 11 Call with the presence of worsening symptoms

## 2013-09-16 NOTE — Progress Notes (Signed)
CC:   [name], M.D. [name], MD, [facility]  PROBLEM LIST:  1. Anemia which the patient states has been present all of her life;however, it appears that the patient's anemia has become more severe over the past couple years starting with since 2012. The patient has not received any recent blood transfusions. She underwent a bone marrow aspirate and biopsy on 05/07/2013, which shows hyper cellularity and erythroid hyperplasia along with some mild dyserythropoiesis. There were 4% plasma cells. 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 Storage iron was abundant. Cytogenetics were negative. The patient was started on Aranesp on 05/26/2013. 2. 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  3. Orthostatic lightheadedness.  4. History of DCIS involving the left breast, status post sentinel lymph node dissection which was negative and subsequently a prophylactic right mastectomy along with left-sided mastectomy on 10/09/2006. The patient has therefore had bilateral mastectomies.  5. History of coronary artery disease status post 4 stents, most recent cardiac catheterization was on 04/11/2011.  6. Hypertension.  7. Dyslipidemia.  8. Hypothyroidism.  9. History of lupus in the past.  10. Psoriasis.  11. Recurrent urinary tract infections.  12. Soft systolic ejection murmur.  13. Numbness and pain of feet and legs.  14. Cystocele.  MEDICATIONS:  Reviewed and recorded. Current Outpatient Prescriptions  Medication Sig Dispense Refill  . amLODipine (NORVASC) 10 MG tablet Take 10 mg by mouth every morning.      Marland Kitchen aspirin 81 MG tablet Take 81 mg by mouth at bedtime.       . clopidogrel (PLAVIX) 75 MG tablet Take 1 tablet (75 mg total) by mouth every morning.  30 tablet  1  . CRESTOR 20 MG tablet TAKE ONE TABLET (20 MG TOTAL) BY MOUTH ONCE DAILY.  30 tablet  1  . fluocinonide (LIDEX) 0.05 % cream Apply 1  application topically 2 (two) times daily.       . fluticasone (FLONASE) 50 MCG/ACT nasal spray Place 2 sprays into the nose daily as needed for allergies.       Marland Kitchen levothyroxine (SYNTHROID, LEVOTHROID) 100 MCG tablet Take 100 mcg by mouth daily before breakfast.       . lisinopril-hydrochlorothiazide (PRINZIDE,ZESTORETIC) 20-25 MG per tablet Take 1 tablet by mouth every morning.  30 tablet  1  . Magnesium 500 MG TABS Take 500 mg by mouth daily.      . Multiple Vitamins-Iron (MULTIVITAMINS WITH IRON) TABS Take 1 tablet by mouth daily.      . nitroGLYCERIN (NITROSTAT) 0.4 MG SL tablet Place 0.4 mg under the tongue every 5 (five) minutes as needed for chest pain.      . polyethylene glycol (MIRALAX / GLYCOLAX) packet Take 17 g by mouth daily.      . polyvinyl alcohol (LIQUIFILM TEARS) 1.4 % ophthalmic solution Place 1 drop into both eyes 2 (two) times daily.       . rosuvastatin (CRESTOR) 20 MG tablet Take 20 mg by mouth daily at 12 noon.      . solifenacin (VESICARE) 5 MG tablet Take 5 mg by mouth daily.       . [DISCONTINUED] omeprazole (PRILOSEC) 20 MG capsule Take 20 mg by mouth daily.         No current facility-administered medications for this visit.    TREATMENT PROGRAM:  -Aranesp 300 mcg subcutaneous every 2 weeks for hemoglobin less than 11, started on 05/26/2013.  SMOKING HISTORY:  The patient has never smoked cigarettes.  HISTORY:  Mishelle Hassan was seen today for followup of her anemia with bone marrow on 05/07/2013 suggesting a refractory anemia. She was seen by Dr. Arline Asp this past July.   Her Bone marrow cytogenetics were negative.  We also follow the patient for an IgG kappa monoclonal gammopathy as well as a history of DCIS involving the left breast.  The patient has undergone bilateral mastectomies. The patient was last seen by [place] on 05/26/2013, at which time she was started on Aranesp 300 mcg subcu with the intention to give this to her every 2 weeks for a hemoglobin less than  11.   PHYSICAL EXAMINATION:   BP 136/99  Pulse 81  Temp(Src) 97.7 F (36.5 C) (Oral)  Resp 18  Ht 4\' 9"  (1.448 m)  Wt 124 lb 14.4 oz (56.654 kg)  BMI 27.02 kg/m2 General: Pleasant elderly lady in no acute distress HEENT: There is no scleral icterus.  Mouth and pharynx are benign.   Lymph nodes: There is no peripheral adenopathy palpable.  Lungs: CTAB/L; No wheezes or rhonchi.  Cardiac: RR; S1S2 present + 2/6 SEM.  Abdomen:  S/NT/ND +BSNo splenomegaly.  No abdominal masses or ascites.  Extremities:  No peripheral edema.   Neurologic: Non-focal.   LABORATORY DATA:    CBC    Component Value Date/Time   WBC 5.5 09/16/2013 1230   WBC 7.4 05/07/2013 0715   RBC 3.77 09/16/2013 1230   RBC 2.95* 05/07/2013 0715   HGB 10.9* 09/16/2013 1230   HGB 9.0* 05/07/2013 0715   HCT 32.8* 09/16/2013 1230   HCT 26.4* 05/07/2013 0715   PLT 334 09/16/2013 1230   PLT 357 05/07/2013 0715   MCV 87.0 09/16/2013 1230   MCV 89.5 05/07/2013 0715   MCH 28.9 09/16/2013 1230   MCH 30.5 05/07/2013 0715   MCHC 33.3 09/16/2013 1230   MCHC 34.1 05/07/2013 0715   RDW 19.1* 09/16/2013 1230   RDW 16.0* 05/07/2013 0715   LYMPHSABS 1.7 09/16/2013 1230   LYMPHSABS 2.0 05/07/2013 0715   MONOABS 0.6 09/16/2013 1230   MONOABS 0.6 05/07/2013 0715   EOSABS 0.2 09/16/2013 1230   EOSABS 0.1 05/07/2013 0715   BASOSABS 0.1 09/16/2013 1230   BASOSABS 0.0 05/07/2013 0715   CMP     Component Value Date/Time   NA 136 06/23/2013 1330   NA 140 03/14/2012 1442   K 4.3 06/23/2013 1330   K 3.7 03/14/2012 1442   CL 100 06/23/2013 1330   CL 103 03/14/2012 1442   CO2 27 06/23/2013 1330   CO2 26 03/14/2012 1442   GLUCOSE 119* 06/23/2013 1330   GLUCOSE 112* 03/14/2012 1442   BUN 16.6 06/23/2013 1330   BUN 22 03/14/2012 1442   CREATININE 1.1 06/23/2013 1330   CREATININE 0.98 03/14/2012 1442   CALCIUM 9.2 06/23/2013 1330   CALCIUM 9.7 03/14/2012 1442   PROT 7.3 06/23/2013 1330   PROT 6.5 03/14/2012 1442   ALBUMIN 3.6 06/23/2013 1330   ALBUMIN 4.4 03/14/2012 1442   AST  14 06/23/2013 1330   AST 19 03/14/2012 1442   ALT 9 06/23/2013 1330   ALT 12 03/14/2012 1442   ALKPHOS 47 06/23/2013 1330   ALKPHOS 44 03/14/2012 1442   BILITOT 1.45 Repeated and Verified* 06/23/2013 1330   BILITOT 0.6 03/14/2012 1442   GFRNONAA >60 04/12/2011 0335   GFRAA  Value: >60        The eGFR has been calculated using the MDRD equation. This  calculation has not been validated in all clinical situations. eGFR's persistently <60 mL/min signify possible Chronic Kidney Disease. 04/12/2011 0335   IMAGING STUDIES:  1. Chest x-ray, 2 view, from 03/22/2010 showed minimal chronic bronchitic changes, otherwise normal.  2. Portable chest x-ray, 1 view, from 04/12/2011 showed no active disease.    PROCEDURES:  1. Bone marrow aspirate and biopsy carried out on 04/07/2013 showed hyper cellularity with erythroid hyperplasia and mild dyserythropoiesis. Storage iron was abundant. There were 4% plasma cells associated with kappa light chain excess. The findings were suggestive of an evolving refractory anemia.  IMPRESSION AND PLAN:    1. Anemia.  --Clinically, Ms. Shane is markedly improved, as is her hemoglobin which has risen from 9.9 to 10.9 today.  The patient will receive another dose of Aranesp 300 mcg subcu.   --We will continue to check CBC every 2 weeks and give Aranesp 300 mcg subcu for a hemoglobin less than 11.   --We will plan to see the patient again in 3 months, at which time we will check CBC and chemistries. _____________________________ Jaclyn Prime Anam Bobby, MD, MS 09/16/2013  2:00 pm

## 2013-09-23 ENCOUNTER — Ambulatory Visit: Payer: Medicare Other

## 2013-10-13 ENCOUNTER — Other Ambulatory Visit: Payer: Self-pay | Admitting: Cardiology

## 2013-10-13 ENCOUNTER — Other Ambulatory Visit: Payer: Self-pay | Admitting: Medical Oncology

## 2013-10-13 DIAGNOSIS — D649 Anemia, unspecified: Secondary | ICD-10-CM

## 2013-10-14 ENCOUNTER — Ambulatory Visit (HOSPITAL_BASED_OUTPATIENT_CLINIC_OR_DEPARTMENT_OTHER): Payer: Medicare Other

## 2013-10-14 ENCOUNTER — Other Ambulatory Visit (HOSPITAL_BASED_OUTPATIENT_CLINIC_OR_DEPARTMENT_OTHER): Payer: Medicare Other | Admitting: Lab

## 2013-10-14 ENCOUNTER — Other Ambulatory Visit: Payer: Self-pay | Admitting: *Deleted

## 2013-10-14 ENCOUNTER — Other Ambulatory Visit: Payer: Self-pay

## 2013-10-14 VITALS — BP 141/67 | HR 77 | Temp 98.4°F

## 2013-10-14 DIAGNOSIS — D649 Anemia, unspecified: Secondary | ICD-10-CM

## 2013-10-14 LAB — CBC WITH DIFFERENTIAL/PLATELET
Basophils Absolute: 0.1 10*3/uL (ref 0.0–0.1)
Eosinophils Absolute: 0.2 10*3/uL (ref 0.0–0.5)
HCT: 30.3 % — ABNORMAL LOW (ref 34.8–46.6)
HGB: 9.8 g/dL — ABNORMAL LOW (ref 11.6–15.9)
MCV: 85.4 fL (ref 79.5–101.0)
MONO%: 9.5 % (ref 0.0–14.0)
NEUT#: 2.3 10*3/uL (ref 1.5–6.5)
NEUT%: 45.7 % (ref 38.4–76.8)
Platelets: 367 10*3/uL (ref 145–400)
RDW: 18.4 % — ABNORMAL HIGH (ref 11.2–14.5)

## 2013-10-14 MED ORDER — DARBEPOETIN ALFA-POLYSORBATE 300 MCG/0.6ML IJ SOLN
300.0000 ug | Freq: Once | INTRAMUSCULAR | Status: AC
  Administered 2013-10-14: 300 ug via SUBCUTANEOUS
  Filled 2013-10-14: qty 0.6

## 2013-10-14 MED ORDER — AMLODIPINE BESYLATE 10 MG PO TABS: 10.0000 mg | ORAL_TABLET | Freq: Every morning | ORAL | Status: DC

## 2013-10-16 ENCOUNTER — Encounter: Payer: Self-pay | Admitting: Cardiology

## 2013-10-16 ENCOUNTER — Ambulatory Visit (INDEPENDENT_AMBULATORY_CARE_PROVIDER_SITE_OTHER): Payer: Medicare Other | Admitting: Cardiology

## 2013-10-16 VITALS — BP 110/62 | HR 70 | Ht <= 58 in | Wt 123.8 lb

## 2013-10-16 DIAGNOSIS — I2510 Atherosclerotic heart disease of native coronary artery without angina pectoris: Secondary | ICD-10-CM

## 2013-10-16 MED ORDER — NITROGLYCERIN 0.4 MG SL SUBL: 0.4000 mg | SUBLINGUAL_TABLET | SUBLINGUAL | Status: AC | PRN

## 2013-10-16 NOTE — Progress Notes (Signed)
HPI The patient presents for evaluation of CAD.   Her she had a history of extensive stents to her LAD. This was in 2009. More recently she's had catheterizations in 2011 and 2012 for symptoms of chest discomfort.   Last year she was having symptoms of chest discomfort and I sent her for a stress echocardiography which was normal. She continues to get many symptoms. These are similar to symptoms she had over the past year but she thinks worse. She says she gets dizzy when she bends down to wash her face. She'll get dizzy when she vacuums. She may get some chest discomfort. She will take nitroglycerin. Her primary provider suggested Imdur but the patient felt worse with this. Her family member thinks she is doing relatively well comparatively. The patient says most of her symptoms are in the morning. She's not had any frank syncope. She is tired. She has to sit down and rest frequently.  Allergies  Allergen Reactions  . Diazepam     REACTION: nightmares  . Nitrofurantoin Monohyd Macro     Hives, rash  . Penicillins   . Codeine Itching, Nausea And Vomiting and Rash  . Latex Rash  . Sulfonamide Derivatives Nausea And Vomiting and Rash    Current Outpatient Prescriptions  Medication Sig Dispense Refill  . amLODipine (NORVASC) 10 MG tablet Take 1 tablet (10 mg total) by mouth every morning.  90 tablet  0  . aspirin 81 MG tablet Take 81 mg by mouth at bedtime.       . clopidogrel (PLAVIX) 75 MG tablet TAKE 1 TABLET (75 MG TOTAL) BY MOUTH EVERY MORNING.  30 tablet  0  . CRESTOR 20 MG tablet TAKE ONE TABLET (20 MG TOTAL) BY MOUTH ONCE DAILY.  30 tablet  0  . darbepoetin (ARANESP) 300 MCG/0.6ML SOLN injection Inject 300 mcg once a month      . fluocinonide (LIDEX) 0.05 % cream Apply 1 application topically 2 (two) times daily.       . fluticasone (FLONASE) 50 MCG/ACT nasal spray Place 2 sprays into the nose daily as needed for allergies.       Marland Kitchen levothyroxine (SYNTHROID, LEVOTHROID) 100 MCG tablet  Take 100 mcg by mouth daily before breakfast.       . lisinopril-hydrochlorothiazide (PRINZIDE,ZESTORETIC) 20-25 MG per tablet TAKE 1 TABLET BY MOUTH EVERY MORNING.  30 tablet  0  . Magnesium 500 MG TABS Take 500 mg by mouth daily.      . nitroGLYCERIN (NITROSTAT) 0.4 MG SL tablet Place 0.4 mg under the tongue every 5 (five) minutes as needed for chest pain.      . polyethylene glycol (MIRALAX / GLYCOLAX) packet Take 17 g by mouth daily.      . polyvinyl alcohol (LIQUIFILM TEARS) 1.4 % ophthalmic solution Place 1 drop into both eyes 2 (two) times daily.       . solifenacin (VESICARE) 5 MG tablet Take 5 mg by mouth daily.       . [DISCONTINUED] omeprazole (PRILOSEC) 20 MG capsule Take 20 mg by mouth daily.         No current facility-administered medications for this visit.    Past Medical History  Diagnosis Date  . Coronary artery disease     s/p PCI of LAD in 5-09 c/b dissection- > promus to LAD x 4  . Hypertension   . Hyperlipidemia   . Syncope and collapse   . Dizziness     chronic  .  Breast cancer   . Hypothyroidism   . Lupus     hx of  . Diverticular disease     Past Surgical History  Procedure Laterality Date  . Cardiac catheterization  4-11    patent LAD stents. 70% OM lesion, normal EF and RH pressures  . Mastectomy      bilateral  . Colon resection      partial  . Abdominal hysterectomy    . Appendectomy      ROS:  Back, left hip pain.  Otherwise as stated in the HPI and negative for all other systems.  PHYSICAL EXAM BP 110/62  Pulse 70  Ht 4\' 9"  (1.448 m)  Wt 123 lb 12.8 oz (56.155 kg)  BMI 26.78 kg/m2 GENERAL:  Well appearing HEENT:  Pupils equal round and reactive, fundi not visualized, oral mucosa unremarkable NECK:  No jugular venous distention, waveform within normal limits, carotid upstroke brisk and symmetric, no bruits, no thyromegaly LYMPHATICS:  No cervical, inguinal adenopathy LUNGS:  Clear to auscultation bilaterally BACK:  No CVA tenderness,  lordosis CHEST:  Bilateral mastectomy HEART:  PMI not displaced or sustained,S1 and S2 within normal limits, no S3, no S4, no clicks, no rubs, soft apical systolic murmur ABD:  Flat, positive bowel sounds normal in frequency in pitch, no bruits, no rebound, no guarding, no midline pulsatile mass, no hepatomegaly, no splenomegaly EXT:  2 plus pulses throughout, no edema, no cyanosis no clubbing SKIN:  No rashes no nodules NEURO:  Cranial nerves II through XII grossly intact, motor grossly intact throughout PSYCH:  Cognitively intact, oriented to person place and time  EKG:  Sinus rhythm, rate 70, left axis deviation, left anterior fascicular block, poor anterior R-wave progression, old anteroseptal infarct, no acute ST T wave changes.  10/16/2013  ASSESSMENT AND PLAN  CAD, NATIVE VESSEL -  Is again very difficult to assess the patient's symptoms.  Reviewing the previous records the symptoms sound similar to her previous complaints last year when she had a negative stress echocardiogram. Her family member thinks that she's actually doing well although the patient thinks she is doing worse. Most of her complaints however some around dizziness and fatigue particularly in the morning. Therefore, we will splint her medications and she will take the lisinopril HCTZ one half tablet twice a day. She will remain on the meds as listed and let me know how she does with this. At this point I don't think further cardiovascular imaging is indicated I will have a low threshold for this.   HYPERTENSION, UNSPECIFIED -  The blood pressure is at target. No change in medications is indicated.   I cannot reduce the dose as she does say  Her blood pressure sometimes is high.

## 2013-10-16 NOTE — Patient Instructions (Signed)
The current medical regimen is effective;  continue present plan and medications.  Follow up in 1 year with Dr Hochrein.  You will receive a letter in the mail 2 months before you are due.  Please call us when you receive this letter to schedule your follow up appointment.  

## 2013-11-05 ENCOUNTER — Other Ambulatory Visit: Payer: Self-pay

## 2013-11-12 ENCOUNTER — Other Ambulatory Visit: Payer: Self-pay | Admitting: Cardiology

## 2013-11-18 ENCOUNTER — Other Ambulatory Visit (HOSPITAL_BASED_OUTPATIENT_CLINIC_OR_DEPARTMENT_OTHER): Payer: Medicare Other | Admitting: Lab

## 2013-11-18 ENCOUNTER — Ambulatory Visit (HOSPITAL_BASED_OUTPATIENT_CLINIC_OR_DEPARTMENT_OTHER): Payer: Medicare Other

## 2013-11-18 VITALS — BP 141/61 | HR 82 | Temp 98.7°F

## 2013-11-18 DIAGNOSIS — D649 Anemia, unspecified: Secondary | ICD-10-CM

## 2013-11-18 LAB — CBC WITH DIFFERENTIAL/PLATELET
BASO%: 0.9 % (ref 0.0–2.0)
EOS%: 2.1 % (ref 0.0–7.0)
HCT: 26 % — ABNORMAL LOW (ref 34.8–46.6)
LYMPH%: 31.5 % (ref 14.0–49.7)
MCH: 27.7 pg (ref 25.1–34.0)
MCHC: 32.3 g/dL (ref 31.5–36.0)
NEUT%: 57.8 % (ref 38.4–76.8)
Platelets: 288 10*3/uL (ref 145–400)
RDW: 18.8 % — ABNORMAL HIGH (ref 11.2–14.5)
WBC: 5.7 10*3/uL (ref 3.9–10.3)
nRBC: 1 % — ABNORMAL HIGH (ref 0–0)

## 2013-11-18 MED ORDER — DARBEPOETIN ALFA-POLYSORBATE 300 MCG/0.6ML IJ SOLN
300.0000 ug | Freq: Once | INTRAMUSCULAR | Status: AC
  Administered 2013-11-18: 300 ug via SUBCUTANEOUS
  Filled 2013-11-18: qty 0.6

## 2013-11-18 NOTE — Patient Instructions (Signed)
Darbepoetin Alfa injection What is this medicine? DARBEPOETIN ALFA (dar be POE e tin AL fa) helps your body make more red blood cells. It is used to treat anemia caused by chronic kidney failure and chemotherapy. This medicine may be used for other purposes; ask your health care provider or pharmacist if you have questions. COMMON BRAND NAME(S): Aranesp What should I tell my health care provider before I take this medicine? They need to know if you have any of these conditions: -blood clotting disorders or history of blood clots -cancer patient not on chemotherapy -cystic fibrosis -heart disease, such as angina, heart failure, or a history of a heart attack -hemoglobin level of 12 g/dL or greater -high blood pressure -low levels of folate, iron, or vitamin B12 -seizures -an unusual or allergic reaction to darbepoetin, erythropoietin, albumin, hamster proteins, latex, other medicines, foods, dyes, or preservatives -pregnant or trying to get pregnant -breast-feeding How should I use this medicine? This medicine is for injection into a vein or under the skin. It is usually given by a health care professional in a hospital or clinic setting. If you get this medicine at home, you will be taught how to prepare and give this medicine. Do not shake the solution before you withdraw a dose. Use exactly as directed. Take your medicine at regular intervals. Do not take your medicine more often than directed. It is important that you put your used needles and syringes in a special sharps container. Do not put them in a trash can. If you do not have a sharps container, call your pharmacist or healthcare provider to get one. Talk to your pediatrician regarding the use of this medicine in children. While this medicine may be used in children as young as 1 year for selected conditions, precautions do apply. Overdosage: If you think you have taken too much of this medicine contact a poison control center or  emergency room at once. NOTE: This medicine is only for you. Do not share this medicine with others. What if I miss a dose? If you miss a dose, take it as soon as you can. If it is almost time for your next dose, take only that dose. Do not take double or extra doses. What may interact with this medicine? Do not take this medicine with any of the following medications: -epoetin alfa This list may not describe all possible interactions. Give your health care provider a list of all the medicines, herbs, non-prescription drugs, or dietary supplements you use. Also tell them if you smoke, drink alcohol, or use illegal drugs. Some items may interact with your medicine. What should I watch for while using this medicine? Visit your prescriber or health care professional for regular checks on your progress and for the needed blood tests and blood pressure measurements. It is especially important for the doctor to make sure your hemoglobin level is in the desired range, to limit the risk of potential side effects and to give you the best benefit. Keep all appointments for any recommended tests. Check your blood pressure as directed. Ask your doctor what your blood pressure should be and when you should contact him or her. As your body makes more red blood cells, you may need to take iron, folic acid, or vitamin B supplements. Ask your doctor or health care provider which products are right for you. If you have kidney disease continue dietary restrictions, even though this medication can make you feel better. Talk with your doctor or health   care professional about the foods you eat and the vitamins that you take. What side effects may I notice from receiving this medicine? Side effects that you should report to your doctor or health care professional as soon as possible: -allergic reactions like skin rash, itching or hives, swelling of the face, lips, or tongue -breathing problems -changes in vision -chest  pain -confusion, trouble speaking or understanding -feeling faint or lightheaded, falls -high blood pressure -muscle aches or pains -pain, swelling, warmth in the leg -rapid weight gain -severe headaches -sudden numbness or weakness of the face, arm or leg -trouble walking, dizziness, loss of balance or coordination -seizures (convulsions) -swelling of the ankles, feet, hands -unusually weak or tired Side effects that usually do not require medical attention (report to your doctor or health care professional if they continue or are bothersome): -diarrhea -fever, chills (flu-like symptoms) -headaches -nausea, vomiting -redness, stinging, or swelling at site where injected This list may not describe all possible side effects. Call your doctor for medical advice about side effects. You may report side effects to FDA at 1-800-FDA-1088. Where should I keep my medicine? Keep out of the reach of children. Store in a refrigerator between 2 and 8 degrees C (36 and 46 degrees F). Do not freeze. Do not shake. Throw away any unused portion if using a single-dose vial. Throw away any unused medicine after the expiration date. NOTE: This sheet is a summary. It may not cover all possible information. If you have questions about this medicine, talk to your doctor, pharmacist, or health care provider.  2014, [employer]vier/[employer]. (2008-11-30 10:23:57)  

## 2013-12-11 ENCOUNTER — Other Ambulatory Visit: Payer: Self-pay | Admitting: Cardiology

## 2013-12-16 ENCOUNTER — Other Ambulatory Visit: Payer: Medicare Other | Admitting: Lab

## 2013-12-16 ENCOUNTER — Telehealth: Payer: Self-pay | Admitting: Internal Medicine

## 2013-12-16 ENCOUNTER — Ambulatory Visit (HOSPITAL_BASED_OUTPATIENT_CLINIC_OR_DEPARTMENT_OTHER): Payer: Medicare Other

## 2013-12-16 ENCOUNTER — Other Ambulatory Visit (HOSPITAL_BASED_OUTPATIENT_CLINIC_OR_DEPARTMENT_OTHER): Payer: Medicare Other

## 2013-12-16 ENCOUNTER — Ambulatory Visit: Payer: Medicare Other

## 2013-12-16 ENCOUNTER — Ambulatory Visit (HOSPITAL_BASED_OUTPATIENT_CLINIC_OR_DEPARTMENT_OTHER): Payer: Medicare Other | Admitting: Internal Medicine

## 2013-12-16 VITALS — BP 136/62 | HR 82 | Temp 98.4°F | Resp 18 | Ht <= 58 in | Wt 124.1 lb

## 2013-12-16 DIAGNOSIS — C50919 Malignant neoplasm of unspecified site of unspecified female breast: Secondary | ICD-10-CM

## 2013-12-16 DIAGNOSIS — D649 Anemia, unspecified: Secondary | ICD-10-CM

## 2013-12-16 DIAGNOSIS — D472 Monoclonal gammopathy: Secondary | ICD-10-CM

## 2013-12-16 LAB — CBC WITH DIFFERENTIAL/PLATELET
Basophils Absolute: 0.1 10*3/uL (ref 0.0–0.1)
EOS%: 1.5 % (ref 0.0–7.0)
HCT: 26.7 % — ABNORMAL LOW (ref 34.8–46.6)
LYMPH%: 28.5 % (ref 14.0–49.7)
MCH: 29.8 pg (ref 25.1–34.0)
MCV: 88 fL (ref 79.5–101.0)
MONO#: 0.5 10*3/uL (ref 0.1–0.9)
MONO%: 9.6 % (ref 0.0–14.0)
NEUT#: 3.1 10*3/uL (ref 1.5–6.5)
RBC: 3.03 10*6/uL — ABNORMAL LOW (ref 3.70–5.45)
RDW: 18.8 % — ABNORMAL HIGH (ref 11.2–14.5)

## 2013-12-16 LAB — COMPREHENSIVE METABOLIC PANEL (CC13)
ALT: 9 U/L (ref 0–55)
AST: 17 U/L (ref 5–34)
Albumin: 4.1 g/dL (ref 3.5–5.0)
Alkaline Phosphatase: 50 U/L (ref 40–150)
BUN: 17.4 mg/dL (ref 7.0–26.0)
CO2: 27 mEq/L (ref 22–29)
Calcium: 9.5 mg/dL (ref 8.4–10.4)
Creatinine: 0.7 mg/dL (ref 0.6–1.1)
Potassium: 3.7 mEq/L (ref 3.5–5.1)
Sodium: 139 mEq/L (ref 136–145)
Total Bilirubin: 1.22 mg/dL — ABNORMAL HIGH (ref 0.20–1.20)

## 2013-12-16 MED ORDER — DARBEPOETIN ALFA-POLYSORBATE 300 MCG/0.6ML IJ SOLN
300.0000 ug | Freq: Once | INTRAMUSCULAR | Status: AC
  Administered 2013-12-16: 300 ug via SUBCUTANEOUS
  Filled 2013-12-16: qty 0.6

## 2013-12-16 NOTE — Telephone Encounter (Signed)
gave pt appt for lab,md and injection for [facility] and [facility] 2015

## 2013-12-17 NOTE — Progress Notes (Signed)
[facility] OFFICE PROGRESS NOTE  Herb Grays, MD [address] [place] [address]  DIAGNOSIS: MGUS (monoclonal gammopathy of unknown significance) - Plan: CBC with Differential in 1 month, CBC with Differential in 2 months, CBC with Differential, Comprehensive metabolic panel, Immunofixation electrophoresis, IgG, IgA, IgM, Protein electrophoresis, serum, Kappa/lambda light chains  Anemia - Plan: CBC with Differential in 1 month, CBC with Differential in 2 months, CBC with Differential, Comprehensive metabolic panel  Chief Complaint  Patient presents with  . Anemia    CURRENT THERAPY: Aranesp 300 mcg subcu every 4 weeks for a hemoglobin less than 11.   INTERVAL HISTORY: [name] 79 y.o. female with a history of anemia is here for follow-up.  She was last seen by Dr. Arline Asp on 09/16/2013.  Her bone marrow was done on 05/07/2013 suggesting refractory anemia.  Her bone marrow cytogenetics were negative.  We are also following her for IgG kappa MGUS, and a history of DCIS involving the left breast.  She has undergone bilateral mastectomies.  She was started on aranesp 300 mcg subcu on 05/26/2013 with the intention to give this to her every 2 weeks for a hemoglobin less than 11.    Today, she reports feeling more or less the same.  She has a non-productive cough.  Her appetite has improved.  She occasionally has anxiety attacks she reports.  Otherwise, she denies fevers or chills.    MEDICAL HISTORY: Past Medical History  Diagnosis Date  . Coronary artery disease     s/p PCI of LAD in 5-09 c/b dissection- > promus to LAD x 4  . Hypertension   . Hyperlipidemia   . Syncope and collapse   . Dizziness     chronic  . Breast cancer   . Hypothyroidism   . Lupus     hx of  . Diverticular disease     INTERIM HISTORY: has HYPERLIPIDEMIA-MIXED; HYPERTENSION, UNSPECIFIED; CAD, NATIVE VESSEL; DIZZINESS; MURMUR; DYSPNEA; CHEST  PAIN-UNSPECIFIED; Anemia; and MGUS (monoclonal gammopathy of unknown significance) on her problem list.    ALLERGIES:  is allergic to diazepam; nitrofurantoin monohyd macro; penicillins; codeine; latex; and sulfonamide derivatives.  MEDICATIONS: has a current medication list which includes the following prescription(s): amlodipine, aspirin, clopidogrel, clopidogrel, crestor, darbepoetin, fluocinonide cream, fluticasone, levothyroxine, lisinopril-hydrochlorothiazide, nitroglycerin, polyethylene glycol, polyvinyl alcohol, and solifenacin.  SURGICAL HISTORY:  Past Surgical History  Procedure Laterality Date  . Cardiac catheterization  4-11    patent LAD stents. 70% OM lesion, normal EF and RH pressures  . Mastectomy      bilateral  . Colon resection      partial  . Abdominal hysterectomy    . Appendectomy      REVIEW OF SYSTEMS:   Constitutional: Denies fevers, chills or abnormal weight loss Eyes: Denies blurriness of vision Ears, nose, mouth, throat, and face: Denies mucositis or sore throat Respiratory: Denies cough, dyspnea or wheezes Cardiovascular: Denies palpitation, chest discomfort or lower extremity swelling Gastrointestinal:  Denies nausea, heartburn or change in bowel habits Skin: Denies abnormal skin rashes Lymphatics: Denies new lymphadenopathy or easy bruising Neurological:Denies numbness, tingling or new weaknesses Behavioral/Psych: Mood is stable, no new changes  All other systems were reviewed with the patient and are negative.  PHYSICAL EXAMINATION: ECOG PERFORMANCE STATUS: 0 - Asymptomatic  Blood pressure 136/62, pulse 82, temperature 98.4 F (36.9 C), temperature source Oral, resp. rate 18, height 4\' 9"  (1.448 m), weight 124 lb 1.6 oz (56.291  kg).  GENERAL:alert, no distress and comfortable; pleasant elderly lady  SKIN: skin color, texture, turgor are normal, no rashes or significant lesions EYES: normal, Conjunctiva are pink and non-injected, sclera  clear OROPHARYNX:no exudate, no erythema and lips, buccal mucosa, and tongue normal  NECK: supple, thyroid normal size, non-tender, without nodularity LYMPH:  no palpable lymphadenopathy in the cervical, axillary or supraclavicular LUNGS: clear to auscultation and percussion with normal breathing effort HEART: regular rate & rhythm and + 2/6 SEM and no lower extremity edema ABDOMEN:abdomen soft, non-tender and normal bowel sounds Musculoskeletal:no cyanosis of digits and no clubbing  NEURO: alert & oriented x 3 with fluent speech, no focal motor/sensory deficits   LABORATORY DATA: Results for orders placed in visit on 12/16/13 (from the past 48 hour(s))  CBC WITH DIFFERENTIAL     Status: Abnormal   Collection Time    12/16/13  2:17 PM      Result Value Range   WBC 5.3  3.9 - 10.3 10e3/uL   NEUT# 3.1  1.5 - 6.5 10e3/uL   HGB 9.0 (*) 11.6 - 15.9 g/dL   HCT 16.1 (*) 09.6 - 04.5 %   Platelets 343  145 - 400 10e3/uL   MCV 88.0  79.5 - 101.0 fL   MCH 29.8  25.1 - 34.0 pg   MCHC 33.8  31.5 - 36.0 g/dL   RBC 4.09 (*) 8.11 - 9.14 10e6/uL   RDW 18.8 (*) 11.2 - 14.5 %   lymph# 1.5  0.9 - 3.3 10e3/uL   MONO# 0.5  0.1 - 0.9 10e3/uL   Eosinophils Absolute 0.1  0.0 - 0.5 10e3/uL   Basophils Absolute 0.1  0.0 - 0.1 10e3/uL   NEUT% 59.0  38.4 - 76.8 %   LYMPH% 28.5  14.0 - 49.7 %   MONO% 9.6  0.0 - 14.0 %   EOS% 1.5  0.0 - 7.0 %   BASO% 1.4  0.0 - 2.0 %  COMPREHENSIVE METABOLIC PANEL (CC13)     Status: Abnormal   Collection Time    12/16/13  2:18 PM      Result Value Range   Sodium 139  136 - 145 mEq/L   Potassium 3.7  3.5 - 5.1 mEq/L   Chloride 103  98 - 109 mEq/L   CO2 27  22 - 29 mEq/L   Glucose 92  70 - 140 mg/dl   BUN 78.2  7.0 - 95.6 mg/dL   Creatinine 0.7  0.6 - 1.1 mg/dL   Total Bilirubin 2.13 (*) 0.20 - 1.20 mg/dL   Alkaline Phosphatase 50  40 - 150 U/L   AST 17  5 - 34 U/L   ALT 9  0 - 55 U/L   Total Protein 7.6  6.4 - 8.3 g/dL   Albumin 4.1  3.5 - 5.0 g/dL   Calcium 9.5   8.4 - 08.6 mg/dL   Anion Gap 9  3 - 11 mEq/L  KAPPA/LAMBDA LIGHT CHAINS     Status: Abnormal (Preliminary result)   Collection Time    12/16/13  2:18 PM      Result Value Range   Kappa free light chain 3.50 (*) 0.33 - 1.94 mg/dL   Lambda Free Lght Chn 2.52  0.57 - 2.63 mg/dL   Kappa:Lambda Ratio 5.78  0.26 - 1.65       Labs:  Lab Results  Component Value Date   WBC 5.3 12/16/2013   HGB 9.0* 12/16/2013   HCT 26.7* 12/16/2013  MCV 88.0 12/16/2013   PLT 343 12/16/2013   NEUTROABS 3.1 12/16/2013      Chemistry      Component Value Date/Time   NA 139 12/16/2013 1418   NA 140 03/14/2012 1442   K 3.7 12/16/2013 1418   K 3.7 03/14/2012 1442   CL 100 06/23/2013 1330   CL 103 03/14/2012 1442   CO2 27 12/16/2013 1418   CO2 26 03/14/2012 1442   BUN 17.4 12/16/2013 1418   BUN 22 03/14/2012 1442   CREATININE 0.7 12/16/2013 1418   CREATININE 0.98 03/14/2012 1442      Component Value Date/Time   CALCIUM 9.5 12/16/2013 1418   CALCIUM 9.7 03/14/2012 1442   ALKPHOS 50 12/16/2013 1418   ALKPHOS 44 03/14/2012 1442   AST 17 12/16/2013 1418   AST 19 03/14/2012 1442   ALT 9 12/16/2013 1418   ALT 12 03/14/2012 1442   BILITOT 1.22* 12/16/2013 1418   BILITOT 0.6 03/14/2012 1442     Basic Metabolic Panel:  Recent Labs Lab 12/16/13 1418  NA 139  K 3.7  CO2 27  GLUCOSE 92  BUN 17.4  CREATININE 0.7  CALCIUM 9.5   GFR Estimated Creatinine Clearance: 39.1 ml/min (by C-G formula based on Cr of 0.7). Liver Function Tests:  Recent Labs Lab 12/16/13 1418  AST 17  ALT 9  ALKPHOS 50  BILITOT 1.22*  PROT 7.6  ALBUMIN 4.1   CBC:  Recent Labs Lab 12/16/13 1417  WBC 5.3  NEUTROABS 3.1  HGB 9.0*  HCT 26.7*  MCV 88.0  PLT 343   Results for AMORE, ACKMAN (MRN [id number]) as of 12/17/2013 19:18  Ref. Range 03/16/2013 10:22 05/26/2013 11:29 12/16/2013 14:18  IgG (Immunoglobin G), Serum Latest Range: 262 087 4751 mg/dL 4098 1191   IgA Latest Range: 69-380 mg/dL 478    IgM, Serum  Latest Range: 52-322 mg/dL 295    Kappa free light chain Latest Range: 0.33-1.94 mg/dL   6.21 (H)  Lambda Free Lght Chn Latest Range: 0.57-2.63 mg/dL   3.08  Kappa:Lambda Ratio Latest Range: 0.26-1.65    1.39   IMAGING STUDIES:  1. Chest x-ray, 2 view, from 03/22/2010 showed minimal chronic bronchitic changes, otherwise normal.  2. Portable chest x-ray, 1 view, from 04/12/2011 showed no active disease.   PROCEDURES:  1. Bone marrow aspirate and biopsy carried out on 04/07/2013  showed hyper cellularity with erythroid hyperplasia and mild  dyserythropoiesis. Storage iron was abundant. There were 4% plasma  cells associated with kappa light chain excess. The findings were   ASSESSMENT: [name] 79 y.o. female with a history of MGUS (monoclonal gammopathy of unknown significance) - Plan: CBC with Differential in 1 month, CBC with Differential in 2 months, CBC with Differential, Comprehensive metabolic panel, Immunofixation electrophoresis, IgG, IgA, IgM, Protein electrophoresis, serum, Kappa/lambda light chains  Anemia - Plan: CBC with Differential in 1 month, CBC with Differential in 2 months, CBC with Differential, Comprehensive metabolic panel   PLAN:   1. Anemia.  --Clinically, Ms. Shivley is stable with a hemoglobin of 9.0.  The patient will receive another dose of Aranesp 300 mcg subu.  --We will check CBC every 4 weeks and given aranesp 300 mcg subcu for a hemoglobin less than 11.   2. MGUS. --Check SPEP, Kappa lambda light chains q 6 months.   3. Breast cancer. --S/p bilateral mastectomies.   4. Follow-up. --RTC in 3 months and aranesp monthly as noted above. CBC and chemistries on day of next  visit.   All questions were answered. The patient knows to call the clinic with any problems, questions or concerns. We can certainly see the patient much sooner if necessary.  I spent 10 minutes counseling the patient face to face. The total time spent in the appointment was 15  minutes.    Kyna Blahnik, MD 12/17/2013 7:08 PM

## 2013-12-18 LAB — SPEP & IFE WITH QIG
Albumin ELP: 56.9 % (ref 55.8–66.1)
Alpha-2-Globulin: 8.5 % (ref 7.1–11.8)
Beta 2: 3.7 % (ref 3.2–6.5)
Beta Globulin: 5.4 % (ref 4.7–7.2)
IgA: 147 mg/dL (ref 69–380)
M-Spike, %: 1.04 g/dL
Total Protein, Serum Electrophoresis: 7.3 g/dL (ref 6.0–8.3)

## 2013-12-18 LAB — KAPPA/LAMBDA LIGHT CHAINS
Kappa:Lambda Ratio: 1.39 (ref 0.26–1.65)
Lambda Free Lght Chn: 2.52 mg/dL (ref 0.57–2.63)

## 2014-01-13 ENCOUNTER — Other Ambulatory Visit: Payer: Self-pay | Admitting: Cardiology

## 2014-01-15 ENCOUNTER — Ambulatory Visit: Payer: Medicare Other

## 2014-01-15 ENCOUNTER — Ambulatory Visit (HOSPITAL_BASED_OUTPATIENT_CLINIC_OR_DEPARTMENT_OTHER): Payer: Federal, State, Local not specified - PPO

## 2014-01-15 ENCOUNTER — Other Ambulatory Visit (HOSPITAL_BASED_OUTPATIENT_CLINIC_OR_DEPARTMENT_OTHER): Payer: Medicare Other

## 2014-01-15 VITALS — BP 140/59 | HR 75 | Temp 98.8°F

## 2014-01-15 DIAGNOSIS — D649 Anemia, unspecified: Secondary | ICD-10-CM

## 2014-01-15 DIAGNOSIS — D472 Monoclonal gammopathy: Secondary | ICD-10-CM

## 2014-01-15 LAB — CBC WITH DIFFERENTIAL/PLATELET
BASO%: 0.8 % (ref 0.0–2.0)
BASOS ABS: 0 10*3/uL (ref 0.0–0.1)
EOS%: 1.6 % (ref 0.0–7.0)
Eosinophils Absolute: 0.1 10*3/uL (ref 0.0–0.5)
HCT: 27.7 % — ABNORMAL LOW (ref 34.8–46.6)
HGB: 9.2 g/dL — ABNORMAL LOW (ref 11.6–15.9)
LYMPH%: 36.6 % (ref 14.0–49.7)
MCH: 29.2 pg (ref 25.1–34.0)
MCHC: 33.1 g/dL (ref 31.5–36.0)
MCV: 88.3 fL (ref 79.5–101.0)
MONO#: 0.5 10*3/uL (ref 0.1–0.9)
MONO%: 9.5 % (ref 0.0–14.0)
NEUT#: 2.7 10*3/uL (ref 1.5–6.5)
NEUT%: 51.5 % (ref 38.4–76.8)
Platelets: 318 10*3/uL (ref 145–400)
RBC: 3.14 10*6/uL — AB (ref 3.70–5.45)
RDW: 18.9 % — AB (ref 11.2–14.5)
WBC: 5.3 10*3/uL (ref 3.9–10.3)
lymph#: 1.9 10*3/uL (ref 0.9–3.3)

## 2014-01-15 MED ORDER — DARBEPOETIN ALFA-POLYSORBATE 300 MCG/0.6ML IJ SOLN
300.0000 ug | Freq: Once | INTRAMUSCULAR | Status: AC
  Administered 2014-01-15: 300 ug via SUBCUTANEOUS
  Filled 2014-01-15: qty 0.6

## 2014-02-19 ENCOUNTER — Other Ambulatory Visit (HOSPITAL_BASED_OUTPATIENT_CLINIC_OR_DEPARTMENT_OTHER): Payer: Medicare Other

## 2014-02-19 ENCOUNTER — Ambulatory Visit: Payer: Medicare Other

## 2014-02-19 ENCOUNTER — Ambulatory Visit (HOSPITAL_BASED_OUTPATIENT_CLINIC_OR_DEPARTMENT_OTHER): Payer: Federal, State, Local not specified - PPO

## 2014-02-19 VITALS — BP 138/44 | HR 84 | Temp 98.2°F

## 2014-02-19 DIAGNOSIS — D472 Monoclonal gammopathy: Secondary | ICD-10-CM

## 2014-02-19 DIAGNOSIS — D649 Anemia, unspecified: Secondary | ICD-10-CM

## 2014-02-19 LAB — CBC WITH DIFFERENTIAL/PLATELET
BASO%: 1.2 % (ref 0.0–2.0)
Basophils Absolute: 0.1 10*3/uL (ref 0.0–0.1)
EOS ABS: 0.2 10*3/uL (ref 0.0–0.5)
EOS%: 2.3 % (ref 0.0–7.0)
HCT: 25.5 % — ABNORMAL LOW (ref 34.8–46.6)
HGB: 8.5 g/dL — ABNORMAL LOW (ref 11.6–15.9)
LYMPH%: 29.6 % (ref 14.0–49.7)
MCH: 28.8 pg (ref 25.1–34.0)
MCHC: 33.2 g/dL (ref 31.5–36.0)
MCV: 86.8 fL (ref 79.5–101.0)
MONO#: 0.5 10*3/uL (ref 0.1–0.9)
MONO%: 7.3 % (ref 0.0–14.0)
NEUT%: 59.6 % (ref 38.4–76.8)
NEUTROS ABS: 4 10*3/uL (ref 1.5–6.5)
Platelets: 307 10*3/uL (ref 145–400)
RBC: 2.94 10*6/uL — AB (ref 3.70–5.45)
RDW: 19.8 % — AB (ref 11.2–14.5)
WBC: 6.6 10*3/uL (ref 3.9–10.3)
lymph#: 2 10*3/uL (ref 0.9–3.3)

## 2014-02-19 MED ORDER — DARBEPOETIN ALFA-POLYSORBATE 300 MCG/0.6ML IJ SOLN
300.0000 ug | Freq: Once | INTRAMUSCULAR | Status: AC
  Administered 2014-02-19: 300 ug via SUBCUTANEOUS
  Filled 2014-02-19: qty 0.6

## 2014-03-16 ENCOUNTER — Ambulatory Visit (HOSPITAL_BASED_OUTPATIENT_CLINIC_OR_DEPARTMENT_OTHER): Payer: Medicare Other | Admitting: Internal Medicine

## 2014-03-16 ENCOUNTER — Ambulatory Visit (HOSPITAL_BASED_OUTPATIENT_CLINIC_OR_DEPARTMENT_OTHER): Payer: Federal, State, Local not specified - PPO

## 2014-03-16 ENCOUNTER — Other Ambulatory Visit (HOSPITAL_BASED_OUTPATIENT_CLINIC_OR_DEPARTMENT_OTHER): Payer: Medicare Other

## 2014-03-16 ENCOUNTER — Ambulatory Visit: Payer: Medicare Other

## 2014-03-16 ENCOUNTER — Telehealth: Payer: Self-pay | Admitting: Internal Medicine

## 2014-03-16 VITALS — BP 169/68 | HR 73 | Temp 98.9°F | Resp 18 | Ht <= 58 in | Wt 126.5 lb

## 2014-03-16 DIAGNOSIS — D649 Anemia, unspecified: Secondary | ICD-10-CM

## 2014-03-16 DIAGNOSIS — D472 Monoclonal gammopathy: Secondary | ICD-10-CM

## 2014-03-16 DIAGNOSIS — C50919 Malignant neoplasm of unspecified site of unspecified female breast: Secondary | ICD-10-CM

## 2014-03-16 LAB — CBC WITH DIFFERENTIAL/PLATELET
BASO%: 0.8 % (ref 0.0–2.0)
Basophils Absolute: 0 10*3/uL (ref 0.0–0.1)
EOS ABS: 0.1 10*3/uL (ref 0.0–0.5)
EOS%: 1.9 % (ref 0.0–7.0)
HCT: 25.8 % — ABNORMAL LOW (ref 34.8–46.6)
HGB: 8.3 g/dL — ABNORMAL LOW (ref 11.6–15.9)
LYMPH#: 1.7 10*3/uL (ref 0.9–3.3)
LYMPH%: 31.9 % (ref 14.0–49.7)
MCH: 27.7 pg (ref 25.1–34.0)
MCHC: 32.2 g/dL (ref 31.5–36.0)
MCV: 86 fL (ref 79.5–101.0)
MONO#: 0.4 10*3/uL (ref 0.1–0.9)
MONO%: 6.6 % (ref 0.0–14.0)
NEUT%: 58.8 % (ref 38.4–76.8)
NEUTROS ABS: 3.1 10*3/uL (ref 1.5–6.5)
NRBC: 1 % — AB (ref 0–0)
PLATELETS: 380 10*3/uL (ref 145–400)
RBC: 3 10*6/uL — AB (ref 3.70–5.45)
RDW: 20.5 % — AB (ref 11.2–14.5)
WBC: 5.3 10*3/uL (ref 3.9–10.3)

## 2014-03-16 LAB — COMPREHENSIVE METABOLIC PANEL (CC13)
ALT: 11 U/L (ref 0–55)
ANION GAP: 9 meq/L (ref 3–11)
AST: 15 U/L (ref 5–34)
Albumin: 3.9 g/dL (ref 3.5–5.0)
Alkaline Phosphatase: 40 U/L (ref 40–150)
BUN: 16.7 mg/dL (ref 7.0–26.0)
CALCIUM: 9.3 mg/dL (ref 8.4–10.4)
CHLORIDE: 102 meq/L (ref 98–109)
CO2: 26 meq/L (ref 22–29)
Creatinine: 0.7 mg/dL (ref 0.6–1.1)
Glucose: 92 mg/dl (ref 70–140)
Potassium: 3.5 mEq/L (ref 3.5–5.1)
SODIUM: 137 meq/L (ref 136–145)
TOTAL PROTEIN: 7.4 g/dL (ref 6.4–8.3)
Total Bilirubin: 1.04 mg/dL (ref 0.20–1.20)

## 2014-03-16 MED ORDER — DARBEPOETIN ALFA-POLYSORBATE 500 MCG/ML IJ SOLN
500.0000 ug | Freq: Once | INTRAMUSCULAR | Status: AC
  Administered 2014-03-16: 500 ug via SUBCUTANEOUS
  Filled 2014-03-16: qty 1

## 2014-03-16 MED ORDER — DARBEPOETIN ALFA-POLYSORBATE 300 MCG/0.6ML IJ SOLN
300.0000 ug | Freq: Once | INTRAMUSCULAR | Status: DC
  Filled 2014-03-16: qty 0.6

## 2014-03-16 NOTE — Telephone Encounter (Signed)
gv adn printed appt sched and avs for pt for April and May °

## 2014-03-16 NOTE — Patient Instructions (Signed)
Darbepoetin Alfa injection What is this medicine? DARBEPOETIN ALFA (dar be POE e tin AL fa) helps your body make more red blood cells. It is used to treat anemia caused by chronic kidney failure and chemotherapy. This medicine may be used for other purposes; ask your health care provider or pharmacist if you have questions. COMMON BRAND NAME(S): Aranesp What should I tell my health care provider before I take this medicine? They need to know if you have any of these conditions: -blood clotting disorders or history of blood clots -cancer patient not on chemotherapy -cystic fibrosis -heart disease, such as angina, heart failure, or a history of a heart attack -hemoglobin level of 12 g/dL or greater -high blood pressure -low levels of folate, iron, or vitamin B12 -seizures -an unusual or allergic reaction to darbepoetin, erythropoietin, albumin, hamster proteins, latex, other medicines, foods, dyes, or preservatives -pregnant or trying to get pregnant -breast-feeding How should I use this medicine? This medicine is for injection into a vein or under the skin. It is usually given by a health care professional in a hospital or clinic setting. If you get this medicine at home, you will be taught how to prepare and give this medicine. Do not shake the solution before you withdraw a dose. Use exactly as directed. Take your medicine at regular intervals. Do not take your medicine more often than directed. It is important that you put your used needles and syringes in a special sharps container. Do not put them in a trash can. If you do not have a sharps container, call your pharmacist or healthcare provider to get one. Talk to your pediatrician regarding the use of this medicine in children. While this medicine may be used in children as young as 1 year for selected conditions, precautions do apply. Overdosage: If you think you have taken too much of this medicine contact a poison control center or  emergency room at once. NOTE: This medicine is only for you. Do not share this medicine with others. What if I miss a dose? If you miss a dose, take it as soon as you can. If it is almost time for your next dose, take only that dose. Do not take double or extra doses. What may interact with this medicine? Do not take this medicine with any of the following medications: -epoetin alfa This list may not describe all possible interactions. Give your health care provider a list of all the medicines, herbs, non-prescription drugs, or dietary supplements you use. Also tell them if you smoke, drink alcohol, or use illegal drugs. Some items may interact with your medicine. What should I watch for while using this medicine? Visit your prescriber or health care professional for regular checks on your progress and for the needed blood tests and blood pressure measurements. It is especially important for the doctor to make sure your hemoglobin level is in the desired range, to limit the risk of potential side effects and to give you the best benefit. Keep all appointments for any recommended tests. Check your blood pressure as directed. Ask your doctor what your blood pressure should be and when you should contact him or her. As your body makes more red blood cells, you may need to take iron, folic acid, or vitamin B supplements. Ask your doctor or health care provider which products are right for you. If you have kidney disease continue dietary restrictions, even though this medication can make you feel better. Talk with your doctor or health   care professional about the foods you eat and the vitamins that you take. What side effects may I notice from receiving this medicine? Side effects that you should report to your doctor or health care professional as soon as possible: -allergic reactions like skin rash, itching or hives, swelling of the face, lips, or tongue -breathing problems -changes in vision -chest  pain -confusion, trouble speaking or understanding -feeling faint or lightheaded, falls -high blood pressure -muscle aches or pains -pain, swelling, warmth in the leg -rapid weight gain -severe headaches -sudden numbness or weakness of the face, arm or leg -trouble walking, dizziness, loss of balance or coordination -seizures (convulsions) -swelling of the ankles, feet, hands -unusually weak or tired Side effects that usually do not require medical attention (report to your doctor or health care professional if they continue or are bothersome): -diarrhea -fever, chills (flu-like symptoms) -headaches -nausea, vomiting -redness, stinging, or swelling at site where injected This list may not describe all possible side effects. Call your doctor for medical advice about side effects. You may report side effects to FDA at 1-800-FDA-1088. Where should I keep my medicine? Keep out of the reach of children. Store in a refrigerator between 2 and 8 degrees C (36 and 46 degrees F). Do not freeze. Do not shake. Throw away any unused portion if using a single-dose vial. Throw away any unused medicine after the expiration date. NOTE: This sheet is a summary. It may not cover all possible information. If you have questions about this medicine, talk to your doctor, pharmacist, or health care provider.  2014, [employer]vier/[employer]. (2008-11-30 10:23:57)  

## 2014-03-18 LAB — KAPPA/LAMBDA LIGHT CHAINS
Kappa free light chain: 3.7 mg/dL — ABNORMAL HIGH (ref 0.33–1.94)
Kappa:Lambda Ratio: 1.94 — ABNORMAL HIGH (ref 0.26–1.65)
Lambda Free Lght Chn: 1.91 mg/dL (ref 0.57–2.63)

## 2014-03-18 LAB — SPEP & IFE WITH QIG
ALPHA-2-GLOBULIN: 8.1 % (ref 7.1–11.8)
Albumin ELP: 57.8 % (ref 55.8–66.1)
Alpha-1-Globulin: 4.1 % (ref 2.9–4.9)
Beta 2: 3.4 % (ref 3.2–6.5)
Beta Globulin: 5.5 % (ref 4.7–7.2)
GAMMA GLOBULIN: 21.1 % — AB (ref 11.1–18.8)
IGM, SERUM: 129 mg/dL (ref 52–322)
IgA: 125 mg/dL (ref 69–380)
IgG (Immunoglobin G), Serum: 1410 mg/dL (ref 690–1700)
M-Spike, %: 1.02 g/dL
TOTAL PROTEIN, SERUM ELECTROPHOR: 7.1 g/dL (ref 6.0–8.3)

## 2014-03-19 NOTE — Progress Notes (Signed)
[name] OFFICE PROGRESS NOTE  Janet Ou, MD [address] [facility] [place] [address]  DIAGNOSIS: MGUS (monoclonal gammopathy of unknown significance)  Anemia - Plan: CBC with Differential, Comprehensive metabolic panel (Cmet) - [facility], CBC with Differential  Chief Complaint  Patient presents with  . Anemia    CURRENT THERAPY: Aranesp 300 mcg subcu every 4 weeks for a hemoglobin less than 11.  Changed to 500 mcg subcu every 4 weeks for a hemoglobin less than 11 today.   INTERVAL HISTORY: [name] 80 y.o. female with a history of anemia is here for follow-up.  She was last seen by me on 12/16/2013. She is accompanied by her daughter [name]. Her bone marrow was done on 05/07/2013 suggesting refractory anemia.  Her bone marrow cytogenetics were negative.  We are also following her for IgG kappa MGUS, and a history of DCIS involving the left breast.  She has undergone bilateral mastectomies.  She was started on aranesp 300 mcg subcu on 05/26/2013 with the intention to give this to her every 2 weeks for a hemoglobin less than 11.   Today, she reports feeling more or less the same.  She has a non-productive cough.  Her appetite has improved.  She occasionally has anxiety attacks she reports.  She is taking vitamin D with improvement in her energy.  She does have occasional shortness of breath with rapid movement.  She denies any recent hospitalizations or emergency room visit.  She deferred colonoscopy due to her age but her fecal occult blood tests have been negative times three.  Otherwise, she denies fevers or chills.    MEDICAL HISTORY: Past Medical History  Diagnosis Date  . Coronary artery disease     s/p PCI of LAD in 5-09 c/b dissection- > promus to LAD x 4  . Hypertension   . Hyperlipidemia   . Syncope and collapse   . Dizziness     chronic  . Breast cancer   . Hypothyroidism   . Lupus     hx of  . Diverticular disease      INTERIM HISTORY: has HYPERLIPIDEMIA-MIXED; HYPERTENSION, UNSPECIFIED; CAD, NATIVE VESSEL; DIZZINESS; MURMUR; DYSPNEA; CHEST PAIN-UNSPECIFIED; Anemia; and MGUS (monoclonal gammopathy of unknown significance) on her problem list.    ALLERGIES:  is allergic to diazepam; nitrofurantoin monohyd macro; penicillins; codeine; latex; and sulfonamide derivatives.  MEDICATIONS: has a current medication list which includes the following prescription(s): amlodipine, aspirin, clopidogrel, crestor, darbepoetin, ergocalciferol, fluocinonide cream, fluticasone, levothyroxine, lisinopril-hydrochlorothiazide, nitroglycerin, polyvinyl alcohol, solifenacin, and polyethylene glycol.  SURGICAL HISTORY:  Past Surgical History  Procedure Laterality Date  . Cardiac catheterization  4-11    patent LAD stents. 70% OM lesion, normal EF and RH pressures  . Mastectomy      bilateral  . Colon resection      partial  . Abdominal hysterectomy    . Appendectomy      REVIEW OF SYSTEMS:   Constitutional: Denies fevers, chills or abnormal weight loss Eyes: Denies blurriness of vision Ears, nose, mouth, throat, and face: Denies mucositis or sore throat Respiratory: Denies cough, dyspnea or wheezes Cardiovascular: Denies palpitation, chest discomfort or lower extremity swelling Gastrointestinal:  Denies nausea, heartburn or change in bowel habits Skin: Denies abnormal skin rashes Lymphatics: Denies new lymphadenopathy or easy bruising Neurological:Denies numbness, tingling or new weaknesses Behavioral/Psych: Mood is stable, no new changes  All other systems were reviewed with the patient and are negative.  PHYSICAL EXAMINATION: ECOG PERFORMANCE  STATUS: 0 - Asymptomatic  Blood pressure 169/68, pulse 73, temperature 98.9 F (37.2 C), temperature source Oral, resp. rate 18, height 4\' 9"  (1.448 m), weight 126 lb 8 oz (57.38 kg), SpO2 100.00%.  GENERAL:alert, no distress and comfortable; pleasant elderly lady  SKIN:  skin color, texture, turgor are normal, no rashes or significant lesions EYES: normal, Conjunctiva are pink and non-injected, sclera clear OROPHARYNX:no exudate, no erythema and lips, buccal mucosa, and tongue normal  NECK: supple, thyroid normal size, non-tender, without nodularity LYMPH:  no palpable lymphadenopathy in the cervical, axillary or supraclavicular LUNGS: clear to auscultation and percussion with normal breathing effort HEART: regular rate & rhythm and + 2/6 SEM and no lower extremity edema ABDOMEN:abdomen soft, non-tender and normal bowel sounds Musculoskeletal:no cyanosis of digits and no clubbing  NEURO: alert & oriented x 3 with fluent speech, no focal motor/sensory deficits  Labs:  Lab Results  Component Value Date   WBC 5.3 03/16/2014   HGB 8.3* 03/16/2014   HCT 25.8* 03/16/2014   MCV 86.0 03/16/2014   PLT 380 03/16/2014   NEUTROABS 3.1 03/16/2014      Chemistry      Component Value Date/Time   NA 137 03/16/2014 1437   NA 140 03/14/2012 1442   K 3.5 03/16/2014 1437   K 3.7 03/14/2012 1442   CL 100 06/23/2013 1330   CL 103 03/14/2012 1442   CO2 26 03/16/2014 1437   CO2 26 03/14/2012 1442   BUN 16.7 03/16/2014 1437   BUN 22 03/14/2012 1442   CREATININE 0.7 03/16/2014 1437   CREATININE 0.98 03/14/2012 1442      Component Value Date/Time   CALCIUM 9.3 03/16/2014 1437   CALCIUM 9.7 03/14/2012 1442   ALKPHOS 40 03/16/2014 1437   ALKPHOS 44 03/14/2012 1442   AST 15 03/16/2014 1437   AST 19 03/14/2012 1442   ALT 11 03/16/2014 1437   ALT 12 03/14/2012 1442   BILITOT 1.04 03/16/2014 1437   BILITOT 0.6 03/14/2012 1442     Basic Metabolic Panel:  Recent Labs Lab 03/16/14 1437  NA 137  K 3.5  CO2 26  GLUCOSE 92  BUN 16.7  CREATININE 0.7  CALCIUM 9.3   GFR Estimated Creatinine Clearance: 39.5 ml/min (by C-G formula based on Cr of 0.7). Liver Function Tests:  Recent Labs Lab 03/16/14 1437  AST 15  ALT 11  ALKPHOS 40  BILITOT 1.04  PROT 7.4  ALBUMIN 3.9    CBC:  Recent Labs Lab 03/16/14 1436  WBC 5.3  NEUTROABS 3.1  HGB 8.3*  HCT 25.8*  MCV 86.0  PLT 380   Results for Janet, Carlson (MRN [id number]) as of 12/17/2013 19:18  Ref. Range 03/16/2013 10:22 05/26/2013 11:29 12/16/2013 14:18  IgG (Immunoglobin G), Serum Latest Range: 458 093 1941 mg/dL 1380 1290   IgA Latest Range: 69-380 mg/dL 167    IgM, Serum Latest Range: 52-322 mg/dL 141    Kappa free light chain Latest Range: 0.33-1.94 mg/dL   3.50 (H)  Lambda Free Lght Chn Latest Range: 0.57-2.63 mg/dL   2.52  Kappa:Lambda Ratio Latest Range: 0.26-1.65    1.39   IMAGING STUDIES:  1. Chest x-[name], 2 view, from 03/22/2010 showed minimal chronic bronchitic changes, otherwise normal.  2. Portable chest x-[name], 1 view, from 04/12/2011 showed no active disease.   PROCEDURES:  1. Bone marrow aspirate and biopsy carried out on 04/07/2013  showed hyper cellularity with erythroid hyperplasia and mild  dyserythropoiesis. Storage iron was abundant. There were 4%  plasma  cells associated with kappa light chain excess. The findings were   ASSESSMENT: [name] 80 y.o. female with a history of MGUS (monoclonal gammopathy of unknown significance)  Anemia - Plan: CBC with Differential, Comprehensive metabolic panel (Cmet) - [facility], CBC with Differential   PLAN:   1. Anemia.  --Clinically, Ms. Morgan is stable however her hemoglobin trend demonstrates a decrease with a hemoglobin of 8.3 today down from 9 her last visit.  We will increase her aranesp to 500 mcg subcu every 4 weeks and she will receive a dose today.  We provided a handout and had a detailed discussion regarding the indications, benefits and risks of treatment with aranesp.  She was agreeable to continue monthly aranesp shots.  --We will check CBC every 4 weeks and given aranesp 500 mcg subcu for a hemoglobin less than 11.   2. MGUS. --Check SPEP, Kappa lambda light chains q 6 months.   3. Breast cancer. --S/p bilateral  mastectomies.   4. Follow-up. --RTC in 2 months and aranesp monthly as noted above. CBC and chemistries on day of next visit.   All questions were answered. The patient knows to call the clinic with any problems, questions or concerns. We can certainly see the patient much sooner if necessary.  I spent 15 minutes counseling the patient face to face. The total time spent in the appointment was 25 minutes.    Crecencio Kwiatek, MD 03/19/2014 8:51 AM

## 2014-04-13 ENCOUNTER — Other Ambulatory Visit: Payer: Self-pay | Admitting: Cardiology

## 2014-04-13 ENCOUNTER — Other Ambulatory Visit (HOSPITAL_BASED_OUTPATIENT_CLINIC_OR_DEPARTMENT_OTHER): Payer: Medicare Other

## 2014-04-13 ENCOUNTER — Ambulatory Visit (HOSPITAL_BASED_OUTPATIENT_CLINIC_OR_DEPARTMENT_OTHER): Payer: Federal, State, Local not specified - PPO

## 2014-04-13 VITALS — BP 137/51 | HR 76 | Temp 98.4°F

## 2014-04-13 DIAGNOSIS — C50919 Malignant neoplasm of unspecified site of unspecified female breast: Secondary | ICD-10-CM

## 2014-04-13 DIAGNOSIS — D649 Anemia, unspecified: Secondary | ICD-10-CM

## 2014-04-13 LAB — CBC WITH DIFFERENTIAL/PLATELET
BASO%: 0.7 % (ref 0.0–2.0)
Basophils Absolute: 0 10*3/uL (ref 0.0–0.1)
EOS%: 1.7 % (ref 0.0–7.0)
Eosinophils Absolute: 0.1 10*3/uL (ref 0.0–0.5)
HCT: 28.6 % — ABNORMAL LOW (ref 34.8–46.6)
HEMOGLOBIN: 9.2 g/dL — AB (ref 11.6–15.9)
LYMPH#: 1.5 10*3/uL (ref 0.9–3.3)
LYMPH%: 27.9 % (ref 14.0–49.7)
MCH: 28.5 pg (ref 25.1–34.0)
MCHC: 32.2 g/dL (ref 31.5–36.0)
MCV: 88.5 fL (ref 79.5–101.0)
MONO#: 0.5 10*3/uL (ref 0.1–0.9)
MONO%: 9.9 % (ref 0.0–14.0)
NEUT#: 3.2 10*3/uL (ref 1.5–6.5)
NEUT%: 59.8 % (ref 38.4–76.8)
Platelets: 280 10*3/uL (ref 145–400)
RBC: 3.23 10*6/uL — AB (ref 3.70–5.45)
RDW: 20.3 % — AB (ref 11.2–14.5)
WBC: 5.4 10*3/uL (ref 3.9–10.3)

## 2014-04-13 MED ORDER — DARBEPOETIN ALFA-POLYSORBATE 500 MCG/ML IJ SOLN
500.0000 ug | Freq: Once | INTRAMUSCULAR | Status: AC
  Administered 2014-04-13: 500 ug via SUBCUTANEOUS
  Filled 2014-04-13: qty 1

## 2014-05-11 ENCOUNTER — Telehealth: Payer: Self-pay | Admitting: Internal Medicine

## 2014-05-11 ENCOUNTER — Ambulatory Visit (HOSPITAL_BASED_OUTPATIENT_CLINIC_OR_DEPARTMENT_OTHER): Payer: Medicare Other | Admitting: Internal Medicine

## 2014-05-11 ENCOUNTER — Other Ambulatory Visit (HOSPITAL_BASED_OUTPATIENT_CLINIC_OR_DEPARTMENT_OTHER): Payer: Medicare Other

## 2014-05-11 ENCOUNTER — Ambulatory Visit (HOSPITAL_BASED_OUTPATIENT_CLINIC_OR_DEPARTMENT_OTHER): Payer: Federal, State, Local not specified - PPO

## 2014-05-11 ENCOUNTER — Other Ambulatory Visit: Payer: Self-pay | Admitting: Cardiology

## 2014-05-11 ENCOUNTER — Encounter: Payer: Self-pay | Admitting: Internal Medicine

## 2014-05-11 VITALS — BP 128/40 | HR 67 | Temp 98.5°F | Resp 18 | Ht <= 58 in | Wt 124.7 lb

## 2014-05-11 DIAGNOSIS — D472 Monoclonal gammopathy: Secondary | ICD-10-CM

## 2014-05-11 DIAGNOSIS — D649 Anemia, unspecified: Secondary | ICD-10-CM

## 2014-05-11 DIAGNOSIS — Z853 Personal history of malignant neoplasm of breast: Secondary | ICD-10-CM

## 2014-05-11 LAB — CBC WITH DIFFERENTIAL/PLATELET
BASO%: 0.5 % (ref 0.0–2.0)
BASOS ABS: 0 10*3/uL (ref 0.0–0.1)
EOS ABS: 0.1 10*3/uL (ref 0.0–0.5)
EOS%: 1.7 % (ref 0.0–7.0)
HCT: 28.6 % — ABNORMAL LOW (ref 34.8–46.6)
HEMOGLOBIN: 9.2 g/dL — AB (ref 11.6–15.9)
LYMPH#: 1.4 10*3/uL (ref 0.9–3.3)
LYMPH%: 34 % (ref 14.0–49.7)
MCH: 28 pg (ref 25.1–34.0)
MCHC: 32.2 g/dL (ref 31.5–36.0)
MCV: 86.9 fL (ref 79.5–101.0)
MONO#: 0.4 10*3/uL (ref 0.1–0.9)
MONO%: 10.3 % (ref 0.0–14.0)
NEUT%: 53.5 % (ref 38.4–76.8)
NEUTROS ABS: 2.2 10*3/uL (ref 1.5–6.5)
Platelets: 277 10*3/uL (ref 145–400)
RBC: 3.29 10*6/uL — AB (ref 3.70–5.45)
RDW: 20.3 % — AB (ref 11.2–14.5)
WBC: 4.2 10*3/uL (ref 3.9–10.3)

## 2014-05-11 LAB — COMPREHENSIVE METABOLIC PANEL (CC13)
ALBUMIN: 3.7 g/dL (ref 3.5–5.0)
ALT: 10 U/L (ref 0–55)
AST: 14 U/L (ref 5–34)
Alkaline Phosphatase: 43 U/L (ref 40–150)
Anion Gap: 7 mEq/L (ref 3–11)
BUN: 25.7 mg/dL (ref 7.0–26.0)
CALCIUM: 9.5 mg/dL (ref 8.4–10.4)
CHLORIDE: 105 meq/L (ref 98–109)
CO2: 27 mEq/L (ref 22–29)
Creatinine: 0.8 mg/dL (ref 0.6–1.1)
GLUCOSE: 95 mg/dL (ref 70–140)
POTASSIUM: 4.5 meq/L (ref 3.5–5.1)
SODIUM: 138 meq/L (ref 136–145)
TOTAL PROTEIN: 7 g/dL (ref 6.4–8.3)
Total Bilirubin: 0.78 mg/dL (ref 0.20–1.20)

## 2014-05-11 MED ORDER — DARBEPOETIN ALFA-POLYSORBATE 500 MCG/ML IJ SOLN
500.0000 ug | Freq: Once | INTRAMUSCULAR | Status: AC
  Administered 2014-05-11: 500 ug via SUBCUTANEOUS
  Filled 2014-05-11: qty 1

## 2014-05-11 NOTE — Telephone Encounter (Signed)
gv and printed appt sched and avs for pt for June thru Aug °

## 2014-05-11 NOTE — Progress Notes (Signed)
[facility] OFFICE PROGRESS NOTE  Florina Ou, MD [place] [address]  DIAGNOSIS: Anemia  MGUS (monoclonal gammopathy of unknown significance)  Chief Complaint  Patient presents with  . MGUS (monoclonal gammopathy of unknown significance)    CURRENT THERAPY: Aranesp 300 mcg subcu every 4 weeks for a hemoglobin less than 11.  Changed to 500 mcg subcu every 4 weeks for a hemoglobin less than 11 today.   INTERVAL HISTORY: [name] 80 y.o. female with a history of anemia is here for follow-up.  She was last seen by me on 03/16/14. Her bone marrow was done on 05/07/2013 suggesting refractory anemia.  Her bone marrow cytogenetics were negative.  We are also following her for IgG kappa MGUS, and a history of DCIS involving the left breast.  She has undergone bilateral mastectomies.  She was started on aranesp 300 mcg subcu on 05/26/2013 with the intention to give this to her every 2 weeks for a hemoglobin less than 11.  We increased it to 500 mcg every 4 weeks for a hemoglobin less than 11. Today, she reports feeling more or less the same.  She has a non-productive cough.  Her appetite has improved.  She occasionally has anxiety attacks she reports.  She is taking vitamin D with improvement in her energy.  She deferred colonoscopy due to her age but her fecal occult blood tests have been negative times three.  Otherwise, she denies fevers or chills.    MEDICAL HISTORY: Past Medical History  Diagnosis Date  . Coronary artery disease     s/p PCI of LAD in 5-09 c/b dissection- > promus to LAD x 4  . Hypertension   . Hyperlipidemia   . Syncope and collapse   . Dizziness     chronic  . Breast cancer   . Hypothyroidism   . Lupus     hx of  . Diverticular disease     INTERIM HISTORY: has HYPERLIPIDEMIA-MIXED; HYPERTENSION, UNSPECIFIED; CAD, NATIVE VESSEL; DIZZINESS; MURMUR; DYSPNEA; CHEST PAIN-UNSPECIFIED; Anemia; and MGUS  (monoclonal gammopathy of unknown significance) on her problem list.    ALLERGIES:  is allergic to diazepam; nitrofurantoin monohyd macro; penicillins; codeine; latex; and sulfonamide derivatives.  MEDICATIONS: has a current medication list which includes the following prescription(s): amlodipine, aspirin, clopidogrel, crestor, darbepoetin, ergocalciferol, fluocinonide cream, fluticasone, levothyroxine, nitroglycerin, polyethylene glycol, polyvinyl alcohol, and solifenacin.  SURGICAL HISTORY:  Past Surgical History  Procedure Laterality Date  . Cardiac catheterization  4-11    patent LAD stents. 70% OM lesion, normal EF and RH pressures  . Mastectomy      bilateral  . Colon resection      partial  . Abdominal hysterectomy    . Appendectomy      REVIEW OF SYSTEMS:   Constitutional: Denies fevers, chills or abnormal weight loss Eyes: Denies blurriness of vision Ears, nose, mouth, throat, and face: Denies mucositis or sore throat Respiratory: Denies cough, dyspnea or wheezes Cardiovascular: Denies palpitation, chest discomfort or lower extremity swelling Gastrointestinal:  Denies nausea, heartburn or change in bowel habits Skin: Denies abnormal skin rashes Lymphatics: Denies new lymphadenopathy or easy bruising Neurological:Denies numbness, tingling or new weaknesses Behavioral/Psych: Mood is stable, no new changes  All other systems were reviewed with the patient and are negative.  PHYSICAL EXAMINATION: ECOG PERFORMANCE STATUS: 0 - Asymptomatic  Blood pressure 128/40, pulse 67, temperature 98.5 F (36.9 C), temperature source Oral, resp. rate 18, height 4\' 9"  (  1.448 m), weight 124 lb 11.2 oz (56.564 kg).  GENERAL:alert, no distress and comfortable; pleasant elderly lady  SKIN: skin color, texture, turgor are normal, no rashes or significant lesions EYES: normal, Conjunctiva are pink and non-injected, sclera clear OROPHARYNX:no exudate, no erythema and lips, buccal mucosa, and  tongue normal  NECK: supple, thyroid normal size, non-tender, without nodularity LYMPH:  no palpable lymphadenopathy in the cervical, axillary or supraclavicular LUNGS: clear to auscultation and percussion with normal breathing effort HEART: regular rate & rhythm and + 2/6 SEM and no lower extremity edema ABDOMEN:abdomen soft, non-tender and normal bowel sounds Musculoskeletal:no cyanosis of digits and no clubbing  NEURO: alert & oriented x 3 with fluent speech, no focal motor/sensory deficits  Labs:  Lab Results  Component Value Date   WBC 4.2 05/11/2014   HGB 9.2* 05/11/2014   HCT 28.6* 05/11/2014   MCV 86.9 05/11/2014   PLT 277 05/11/2014   NEUTROABS 2.2 05/11/2014      Chemistry      Component Value Date/Time   NA 137 03/16/2014 1437   NA 140 03/14/2012 1442   K 3.5 03/16/2014 1437   K 3.7 03/14/2012 1442   CL 100 06/23/2013 1330   CL 103 03/14/2012 1442   CO2 26 03/16/2014 1437   CO2 26 03/14/2012 1442   BUN 16.7 03/16/2014 1437   BUN 22 03/14/2012 1442   CREATININE 0.7 03/16/2014 1437   CREATININE 0.98 03/14/2012 1442      Component Value Date/Time   CALCIUM 9.3 03/16/2014 1437   CALCIUM 9.7 03/14/2012 1442   ALKPHOS 40 03/16/2014 1437   ALKPHOS 44 03/14/2012 1442   AST 15 03/16/2014 1437   AST 19 03/14/2012 1442   ALT 11 03/16/2014 1437   ALT 12 03/14/2012 1442   BILITOT 1.04 03/16/2014 1437   BILITOT 0.6 03/14/2012 1442     Basic Metabolic Panel: No results found for this basename: NA, K, CL, CO2, GLUCOSE, BUN, CREATININE, CALCIUM, MG, PHOS,  in the last 168 hours GFR Estimated Creatinine Clearance: 38.5 ml/min (by C-G formula based on Cr of 0.7). Liver Function Tests: No results found for this basename: AST, ALT, ALKPHOS, BILITOT, PROT, ALBUMIN,  in the last 168 hours CBC:  Recent Labs Lab 05/11/14 0953  WBC 4.2  NEUTROABS 2.2  HGB 9.2*  HCT 28.6*  MCV 86.9  PLT 277   IMAGING STUDIES:  1. Chest x-ray, 2 view, from 03/22/2010 showed minimal chronic bronchitic changes,  otherwise normal.  2. Portable chest x-ray, 1 view, from 04/12/2011 showed no active disease.   PROCEDURES:  1. Bone marrow aspirate and biopsy carried out on 04/07/2013  showed hyper cellularity with erythroid hyperplasia and mild  dyserythropoiesis. Storage iron was abundant. There were 4% plasma  cells associated with kappa light chain excess. The findings were   ASSESSMENT: [name] 80 y.o. female with a history of Anemia  MGUS (monoclonal gammopathy of unknown significance)   PLAN:   1. Anemia.  --Clinically, Ms. Laye is stable and hemoglobin trend demonstrates an increase with a hemoglobin of 9.2 up from 8.3 last visit.  We will continue her aranesp to 500 mcg subcu every 4 weeks and she will receive a dose today.  She was agreeable to continue monthly aranesp shots.  --We will check CBC every 4 weeks and given aranesp 500 mcg subcu for a hemoglobin less than 11.   2. MGUS. --Check SPEP, Kappa lambda light chains q 6 months.   3. Breast cancer. --  S/p bilateral mastectomies.   4. Follow-up. --RTC in 3 months and aranesp monthly as noted above. CBC and chemistries on day of next visit.   All questions were answered. The patient knows to call the clinic with any problems, questions or concerns. We can certainly see the patient much sooner if necessary.  I spent 15 minutes counseling the patient face to face. The total time spent in the appointment was 25 minutes.    Concha Norway, MD 05/11/2014 10:27 AM

## 2014-05-18 ENCOUNTER — Telehealth: Payer: Self-pay

## 2014-05-18 NOTE — Telephone Encounter (Signed)
Returning call made earlier. [name] spoke of side effects from aranesp and that pt had a kidney infection. LVM making sure pt is seeing MD and is on antibiotics for the infection and what other SE is she experiencing from the aranesp. Asked sue to call us back and clarify.

## 2014-05-18 NOTE — Telephone Encounter (Signed)
Lonny Prude called with concern of side effects from aranesp. aranesp was increased from 300 to 500 in March. Every injection since she has gotten flu like symptoms, achey, nausea, upset stomach. These symptoms last about 5-7 days. Also currently she went to urgent care with dysuria and flank pain. She was ddx with kidney infection and put on cipro. Collie Siad called  the urgent care and they did a culture, they will call her if there is a need to change the antibiotic. S/w Dr Juliann Mule and he said he would decrease the aranesp and it was OK to be on the cipro antibiotic. Collie Siad informed of Dr Boyce Medici comments.

## 2014-06-08 ENCOUNTER — Other Ambulatory Visit: Payer: Self-pay | Admitting: Internal Medicine

## 2014-06-08 ENCOUNTER — Ambulatory Visit (HOSPITAL_BASED_OUTPATIENT_CLINIC_OR_DEPARTMENT_OTHER): Payer: Federal, State, Local not specified - PPO

## 2014-06-08 ENCOUNTER — Other Ambulatory Visit (HOSPITAL_BASED_OUTPATIENT_CLINIC_OR_DEPARTMENT_OTHER): Payer: Medicare Other

## 2014-06-08 VITALS — BP 138/55 | HR 69 | Temp 98.1°F

## 2014-06-08 DIAGNOSIS — D649 Anemia, unspecified: Secondary | ICD-10-CM

## 2014-06-08 LAB — CBC WITH DIFFERENTIAL/PLATELET
BASO%: 1.1 % (ref 0.0–2.0)
Basophils Absolute: 0.1 10*3/uL (ref 0.0–0.1)
EOS ABS: 0.1 10*3/uL (ref 0.0–0.5)
EOS%: 1.5 % (ref 0.0–7.0)
HCT: 26.9 % — ABNORMAL LOW (ref 34.8–46.6)
HGB: 8.9 g/dL — ABNORMAL LOW (ref 11.6–15.9)
LYMPH%: 27 % (ref 14.0–49.7)
MCH: 28.2 pg (ref 25.1–34.0)
MCHC: 33.1 g/dL (ref 31.5–36.0)
MCV: 85.4 fL (ref 79.5–101.0)
MONO#: 0.6 10*3/uL (ref 0.1–0.9)
MONO%: 11.3 % (ref 0.0–14.0)
NEUT%: 59.1 % (ref 38.4–76.8)
NEUTROS ABS: 3.4 10*3/uL (ref 1.5–6.5)
Platelets: 347 10*3/uL (ref 145–400)
RBC: 3.16 10*6/uL — AB (ref 3.70–5.45)
RDW: 20.6 % — ABNORMAL HIGH (ref 11.2–14.5)
WBC: 5.7 10*3/uL (ref 3.9–10.3)
lymph#: 1.5 10*3/uL (ref 0.9–3.3)

## 2014-06-08 MED ORDER — DARBEPOETIN ALFA-POLYSORBATE 500 MCG/ML IJ SOLN
500.0000 ug | Freq: Once | INTRAMUSCULAR | Status: DC
  Filled 2014-06-08: qty 1

## 2014-06-08 MED ORDER — DARBEPOETIN ALFA-POLYSORBATE 300 MCG/0.6ML IJ SOLN
300.0000 ug | Freq: Once | INTRAMUSCULAR | Status: AC
  Administered 2014-06-08: 300 ug via SUBCUTANEOUS
  Filled 2014-06-08: qty 0.6

## 2014-07-06 ENCOUNTER — Ambulatory Visit (HOSPITAL_BASED_OUTPATIENT_CLINIC_OR_DEPARTMENT_OTHER): Payer: Federal, State, Local not specified - PPO

## 2014-07-06 ENCOUNTER — Other Ambulatory Visit (HOSPITAL_BASED_OUTPATIENT_CLINIC_OR_DEPARTMENT_OTHER): Payer: Medicare Other

## 2014-07-06 VITALS — BP 146/64 | HR 67 | Temp 97.9°F

## 2014-07-06 DIAGNOSIS — D472 Monoclonal gammopathy: Secondary | ICD-10-CM

## 2014-07-06 DIAGNOSIS — D649 Anemia, unspecified: Secondary | ICD-10-CM

## 2014-07-06 DIAGNOSIS — D558 Other anemias due to enzyme disorders: Secondary | ICD-10-CM

## 2014-07-06 LAB — CBC WITH DIFFERENTIAL/PLATELET
BASO%: 0.7 % (ref 0.0–2.0)
Basophils Absolute: 0 10*3/uL (ref 0.0–0.1)
EOS%: 2.1 % (ref 0.0–7.0)
Eosinophils Absolute: 0.1 10*3/uL (ref 0.0–0.5)
HEMATOCRIT: 28.1 % — AB (ref 34.8–46.6)
HGB: 9 g/dL — ABNORMAL LOW (ref 11.6–15.9)
LYMPH%: 30.6 % (ref 14.0–49.7)
MCH: 27.4 pg (ref 25.1–34.0)
MCHC: 32 g/dL (ref 31.5–36.0)
MCV: 85.7 fL (ref 79.5–101.0)
MONO#: 0.6 10*3/uL (ref 0.1–0.9)
MONO%: 11.3 % (ref 0.0–14.0)
NEUT#: 3.1 10*3/uL (ref 1.5–6.5)
NEUT%: 55.3 % (ref 38.4–76.8)
NRBC: 1 % — AB (ref 0–0)
PLATELETS: 358 10*3/uL (ref 145–400)
RBC: 3.28 10*6/uL — ABNORMAL LOW (ref 3.70–5.45)
RDW: 20.9 % — ABNORMAL HIGH (ref 11.2–14.5)
WBC: 5.7 10*3/uL (ref 3.9–10.3)
lymph#: 1.7 10*3/uL (ref 0.9–3.3)

## 2014-07-06 MED ORDER — DARBEPOETIN ALFA-POLYSORBATE 300 MCG/0.6ML IJ SOLN
300.0000 ug | Freq: Once | INTRAMUSCULAR | Status: AC
  Administered 2014-07-06: 300 ug via SUBCUTANEOUS
  Filled 2014-07-06: qty 0.6

## 2014-07-15 ENCOUNTER — Other Ambulatory Visit: Payer: Self-pay | Admitting: Cardiology

## 2014-07-29 ENCOUNTER — Telehealth: Payer: Self-pay | Admitting: Internal Medicine

## 2014-07-29 NOTE — Telephone Encounter (Signed)
, °

## 2014-08-03 ENCOUNTER — Encounter: Payer: Self-pay | Admitting: Hematology

## 2014-08-03 ENCOUNTER — Ambulatory Visit: Payer: Medicare Other

## 2014-08-03 ENCOUNTER — Ambulatory Visit (HOSPITAL_BASED_OUTPATIENT_CLINIC_OR_DEPARTMENT_OTHER): Payer: Medicare Other

## 2014-08-03 ENCOUNTER — Other Ambulatory Visit (HOSPITAL_BASED_OUTPATIENT_CLINIC_OR_DEPARTMENT_OTHER): Payer: Medicare Other

## 2014-08-03 ENCOUNTER — Other Ambulatory Visit: Payer: Medicare Other

## 2014-08-03 ENCOUNTER — Ambulatory Visit (HOSPITAL_BASED_OUTPATIENT_CLINIC_OR_DEPARTMENT_OTHER): Payer: Medicare Other | Admitting: Hematology

## 2014-08-03 ENCOUNTER — Other Ambulatory Visit (HOSPITAL_COMMUNITY)
Admission: RE | Admit: 2014-08-03 | Discharge: 2014-08-03 | Disposition: A | Discharge status: Home / Self Care | Payer: Medicare Other | Source: Ambulatory Visit | Attending: Hematology | Admitting: Hematology

## 2014-08-03 VITALS — BP 152/57 | HR 90 | Temp 98.2°F | Resp 18 | Ht <= 58 in | Wt 123.9 lb

## 2014-08-03 DIAGNOSIS — D649 Anemia, unspecified: Secondary | ICD-10-CM

## 2014-08-03 DIAGNOSIS — N189 Chronic kidney disease, unspecified: Principal | ICD-10-CM

## 2014-08-03 DIAGNOSIS — D472 Monoclonal gammopathy: Secondary | ICD-10-CM

## 2014-08-03 DIAGNOSIS — D508 Other iron deficiency anemias: Secondary | ICD-10-CM

## 2014-08-03 DIAGNOSIS — D631 Anemia in chronic kidney disease: Secondary | ICD-10-CM

## 2014-08-03 DIAGNOSIS — Z853 Personal history of malignant neoplasm of breast: Secondary | ICD-10-CM

## 2014-08-03 LAB — COMPREHENSIVE METABOLIC PANEL (CC13)
ALT: 7 U/L (ref 0–55)
AST: 14 U/L (ref 5–34)
Albumin: 3.7 g/dL (ref 3.5–5.0)
Alkaline Phosphatase: 53 U/L (ref 40–150)
Anion Gap: 7 mEq/L (ref 3–11)
BUN: 20.6 mg/dL (ref 7.0–26.0)
CALCIUM: 9.1 mg/dL (ref 8.4–10.4)
CHLORIDE: 105 meq/L (ref 98–109)
CO2: 27 mEq/L (ref 22–29)
CREATININE: 0.8 mg/dL (ref 0.6–1.1)
Glucose: 101 mg/dl (ref 70–140)
POTASSIUM: 3.4 meq/L — AB (ref 3.5–5.1)
Sodium: 138 mEq/L (ref 136–145)
Total Bilirubin: 0.93 mg/dL (ref 0.20–1.20)
Total Protein: 7.2 g/dL (ref 6.4–8.3)

## 2014-08-03 LAB — CBC WITH DIFFERENTIAL/PLATELET
BASO%: 1.5 % (ref 0.0–2.0)
Basophils Absolute: 0.1 10*3/uL (ref 0.0–0.1)
EOS%: 1.5 % (ref 0.0–7.0)
Eosinophils Absolute: 0.1 10*3/uL (ref 0.0–0.5)
HCT: 26.3 % — ABNORMAL LOW (ref 34.8–46.6)
HGB: 8.5 g/dL — ABNORMAL LOW (ref 11.6–15.9)
LYMPH#: 1.5 10*3/uL (ref 0.9–3.3)
LYMPH%: 30.4 % (ref 14.0–49.7)
MCH: 27.7 pg (ref 25.1–34.0)
MCHC: 32.3 g/dL (ref 31.5–36.0)
MCV: 85.7 fL (ref 79.5–101.0)
MONO#: 0.4 10*3/uL (ref 0.1–0.9)
MONO%: 8.3 % (ref 0.0–14.0)
NEUT#: 2.8 10*3/uL (ref 1.5–6.5)
NEUT%: 58.3 % (ref 38.4–76.8)
NRBC: 1 % — AB (ref 0–0)
Platelets: 341 10*3/uL (ref 145–400)
RBC: 3.07 10*6/uL — AB (ref 3.70–5.45)
RDW: 20.8 % — ABNORMAL HIGH (ref 11.2–14.5)
WBC: 4.8 10*3/uL (ref 3.9–10.3)

## 2014-08-03 MED ORDER — DARBEPOETIN ALFA-POLYSORBATE 300 MCG/0.6ML IJ SOLN
300.0000 ug | Freq: Once | INTRAMUSCULAR | Status: AC
  Administered 2014-08-03: 300 ug via SUBCUTANEOUS
  Filled 2014-08-03: qty 0.6

## 2014-08-03 NOTE — Progress Notes (Signed)
[facility] OFFICE PROGRESS NOTE  Janet Ou, MD [place] [address]  DIAGNOSIS: MGUS IgG Kappa (Monoclonal gammopathy of unknown significance)/Refractory anemia and MDS  Chief Complaint  Patient presents with  . Follow-up    CURRENT THERAPY: Aranesp 300 mcg subcu every 4 weeks for a hemoglobin less than 11.  She gets aranesp shot today.   INTERVAL HISTORY:  [name] 80 y.o. female of [place] descent who lives in [place] a history of anemia is here for follow-up.  She was last seen by Dr [name] on 05/11/2014. Her bone marrow was done on 05/07/2013 suggesting refractory anemia.  Her bone marrow cytogenetics were negative.  We are also following her for IgG kappa MGUS, and a history of DCIS involving the left breast.  She has undergone bilateral mastectomies.  She was started on aranesp 300 mcg subcu on 05/26/2013 with the intention to give this to her every 2 weeks for a hemoglobin less than 11.  We increased it to 500 mcg every 4 weeks for a hemoglobin less than 11. According to patient and her daughter when she got the 500 mcg dose for 3 months (3/17, 4/14, 5/12) it made her sick. Each time I was in bed for 1 week and normally with 300 mch I feel achy for a day or 2. I was feeling miserable and worn out. Patient had not required blood transfusion with this diagnosis. Her appetite has improved.  She occasionally has anxiety attacks she reports.  She is taking vitamin D with improvement in her energy.  She deferred colonoscopy due to her age but her fecal occult blood tests have been negative times three.  Otherwise, she denies fevers or chills.    MEDICAL HISTORY: Past Medical History  Diagnosis Date  . Coronary artery disease     s/p PCI of LAD in 5-09 c/b dissection- > promus to LAD x 4  . Hypertension   . Hyperlipidemia   . Syncope and collapse   . Dizziness     chronic  . Breast cancer   . Hypothyroidism   .  Lupus     hx of  . Diverticular disease     ALLERGIES:  is allergic to diazepam; nitrofurantoin monohyd macro; penicillins; codeine; latex; and sulfonamide derivatives.  MEDICATIONS: has a current medication list which includes the following prescription(s): amlodipine, aspirin, clopidogrel, clopidogrel, crestor, darbepoetin, ergocalciferol, fluocinonide cream, fluticasone, levothyroxine, nitroglycerin, polyethylene glycol, polyvinyl alcohol, ranolazine, and solifenacin.  SURGICAL HISTORY:  Past Surgical History  Procedure Laterality Date  . Cardiac catheterization  4-11    patent LAD stents. 70% OM lesion, normal EF and RH pressures  . Mastectomy      bilateral  . Colon resection      partial  . Abdominal hysterectomy    . Appendectomy      REVIEW OF SYSTEMS:   Constitutional: Denies fevers, chills or abnormal weight loss Eyes: Denies blurriness of vision Ears, nose, mouth, throat, and face: Denies mucositis or sore throat Respiratory: Denies cough, dyspnea or wheezes Cardiovascular: Denies palpitation, chest discomfort or lower extremity swelling Gastrointestinal:  Denies nausea, heartburn or change in bowel habits Skin: Denies abnormal skin rashes Lymphatics: Denies new lymphadenopathy or easy bruising Neurological:Denies numbness, tingling or new weaknesses Behavioral/Psych: Mood is stable, no new changes  All other systems were reviewed with the patient and are negative.  PHYSICAL EXAMINATION: ECOG PERFORMANCE STATUS: 0 - Asymptomatic  Blood pressure 152/57, pulse 90,  temperature 98.2 F (36.8 C), temperature source Oral, resp. rate 18, height 4\' 9"  (1.448 m), weight 123 lb 14.4 oz (56.201 kg).  GENERAL:alert, no distress and comfortable; pleasant elderly lady  SKIN: skin color, texture, turgor are normal, no rashes or significant lesions EYES: normal, Conjunctiva are pink and non-injected, sclera clear OROPHARYNX:no exudate, no erythema and lips, buccal mucosa, and  tongue normal  NECK: supple, thyroid normal size, non-tender, without nodularity LYMPH:  no palpable lymphadenopathy in the cervical, axillary or supraclavicular LUNGS: clear to auscultation and percussion with normal breathing effort HEART: regular rate & rhythm and + 2/6 SEM and no lower extremity edema ABDOMEN:abdomen soft, non-tender and normal bowel sounds Musculoskeletal:no cyanosis of digits and no clubbing  NEURO: alert & oriented x 3 with fluent speech, no focal motor/sensory deficits  Labs:              Iron Studies: March 2014 showing iron 127, TIBC 265, iron sat 48% and ferritin 225  PROCEDURES:        ASSESSMENT: [name] 80 y.o. 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   PLAN:   1. Anemia.  --Clinically, [name] is stable  Although her hemoglobin trended down from 9.0 to 8.5 gm today.  She will continue her aranesp to 300 mcg subcu every 4 weeks and she will receive a dose today.  She was not tolerating the 500 mcg dose.  --We will check CBC every 4 weeks and given aranesp 300 mcg subcu for a hemoglobin less than 11.  -- I also recommended for patient to start taking a Vitamin B12 1000 mcg daily, Vitamin C 500 mg daily and a regular Women's Multivitamin. All these strategies and micro elements can potentially help her bone marrow environment or milieu and help in erythropoiesis. I also ordered serum iron studies, zinc and copper level on patient. As per NCCN guidelines copper deficiency can mimic MDS and should be ruled out in all patients. She has been recommended colonoscopy before but she is adamant about getting that done. --If we don't see a good response in  Her hemoglobin and hematocrit or if she drops that and shows evidence of symptomatic anemia, we can also consider her for one of the hypomethylating drugs like Vidaza or Dacogen.  2. MGUS (IgG Kappa) --Check SPEP, Kappa lambda light  chains q 6 months. Those were tested today and results are pending. She has no signs of new onset bone or skeletal pain, renal failure, hypercalcemia which are seen in Myeloma.  3. Breast cancer. --S/p bilateral mastectomies. NED.  4. Follow-up. --RTC in 3 months and aranesp monthly as noted above. CBC and chemistries on day of next visit.   All questions were answered. The patient knows to call the clinic with any problems, questions or concerns. We can certainly see the patient much sooner if necessary.  I spent 30 minutes counseling the patient face to face. The total time spent in the appointment was 35 minutes.   Bernadene Bell, MD Medical Hematologist/Oncologist [facility] Pager: [phone] Office No: [phone] 08/03/2014 5:34 PM

## 2014-08-04 ENCOUNTER — Telehealth: Payer: Self-pay | Admitting: Hematology

## 2014-08-04 LAB — IGG, IGA, IGM
IGA: 138 mg/dL (ref 69–380)
IGM, SERUM: 141 mg/dL (ref 52–322)
IgG (Immunoglobin G), Serum: 1610 mg/dL (ref 690–1700)

## 2014-08-04 LAB — KAPPA/LAMBDA LIGHT CHAINS
KAPPA LAMBDA RATIO: 1.93 — AB (ref 0.26–1.65)
Kappa free light chain: 3.56 mg/dL — ABNORMAL HIGH (ref 0.33–1.94)
LAMBDA FREE LGHT CHN: 1.84 mg/dL (ref 0.57–2.63)

## 2014-08-04 LAB — FERRITIN CHCC: FERRITIN: 190 ng/mL (ref 9–269)

## 2014-08-04 LAB — IRON AND TIBC CHCC
%SAT: 38 % (ref 21–57)
Iron: 93 ug/dL (ref 41–142)
TIBC: 246 ug/dL (ref 236–444)
UIBC: 153 ug/dL (ref 120–384)

## 2014-08-04 NOTE — Telephone Encounter (Signed)
lvm for pt regarding to SEpt appt...mailed pt appt sched/avs and letter °

## 2014-08-07 LAB — COPPER, SERUM: Copper: 101 ug/dL (ref 70–175)

## 2014-08-11 LAB — ZAP-70

## 2014-08-31 ENCOUNTER — Other Ambulatory Visit: Payer: Medicare Other

## 2014-08-31 ENCOUNTER — Ambulatory Visit: Payer: Medicare Other

## 2014-09-28 ENCOUNTER — Ambulatory Visit: Payer: Medicare Other

## 2014-09-28 ENCOUNTER — Other Ambulatory Visit (HOSPITAL_BASED_OUTPATIENT_CLINIC_OR_DEPARTMENT_OTHER): Payer: Medicare Other

## 2014-09-28 DIAGNOSIS — D649 Anemia, unspecified: Secondary | ICD-10-CM

## 2014-09-28 LAB — CBC WITH DIFFERENTIAL/PLATELET
BASO%: 0.7 % (ref 0.0–2.0)
Basophils Absolute: 0 10*3/uL (ref 0.0–0.1)
EOS ABS: 0.1 10*3/uL (ref 0.0–0.5)
EOS%: 1.9 % (ref 0.0–7.0)
HCT: 25.8 % — ABNORMAL LOW (ref 34.8–46.6)
HGB: 8.4 g/dL — ABNORMAL LOW (ref 11.6–15.9)
LYMPH%: 33.4 % (ref 14.0–49.7)
MCH: 27.5 pg (ref 25.1–34.0)
MCHC: 32.6 g/dL (ref 31.5–36.0)
MCV: 84.3 fL (ref 79.5–101.0)
MONO#: 0.7 10*3/uL (ref 0.1–0.9)
MONO%: 11.8 % (ref 0.0–14.0)
NEUT#: 3 10*3/uL (ref 1.5–6.5)
NEUT%: 52.2 % (ref 38.4–76.8)
NRBC: 1 % — AB (ref 0–0)
Platelets: 305 10*3/uL (ref 145–400)
RBC: 3.06 10*6/uL — AB (ref 3.70–5.45)
RDW: 20.6 % — ABNORMAL HIGH (ref 11.2–14.5)
WBC: 5.8 10*3/uL (ref 3.9–10.3)
lymph#: 1.9 10*3/uL (ref 0.9–3.3)

## 2014-09-28 NOTE — Progress Notes (Signed)
[name] here [facility] lab and injection.  She is to receive Aranesp if Hgb level low enough (under 11.0).   Today her Hgb is 8.4.   Her son is with her and they are concerned about the reaction she has to the Aranesp injection.  The last 2 or 3 times she has gotten the injection, she has gotten sick.  2nd day she wakes up and feels SOB, nauseated and is unable to get out of bed from feeling too weak.  She feels like this for about 1-2 weeks, then feels better by the time she comes back in for another injection.   She wants to skip the injection this time and see what her blood does by the next injection, in 4 weeks.  I spoke with Dr Lona Kettle about this and he agreed with this since we don't want to make her sick.   She is scheduled to come back for lab and injection the end of October.  She knows that she can call if she starts having problems.

## 2014-10-20 ENCOUNTER — Telehealth: Payer: Self-pay | Admitting: Hematology and Oncology

## 2014-10-26 ENCOUNTER — Ambulatory Visit: Payer: Medicare Other

## 2014-10-26 ENCOUNTER — Other Ambulatory Visit: Payer: Medicare Other

## 2014-11-12 ENCOUNTER — Telehealth: Payer: Self-pay | Admitting: Hematology and Oncology

## 2014-11-12 NOTE — Telephone Encounter (Signed)
, °

## 2014-11-23 ENCOUNTER — Other Ambulatory Visit: Payer: Medicare Other

## 2014-11-23 ENCOUNTER — Ambulatory Visit: Payer: Medicare Other

## 2014-11-23 ENCOUNTER — Ambulatory Visit: Payer: Medicare Other | Admitting: Hematology and Oncology

## 2017-07-02 ENCOUNTER — Other Ambulatory Visit: Payer: Self-pay | Admitting: Nurse Practitioner

## 2017-08-31 ENCOUNTER — Other Ambulatory Visit: Payer: Self-pay | Admitting: Nurse Practitioner

## 2023-09-01 DEATH — deceased
